# Patient Record
Sex: Male | Born: 1940
Health system: Southern US, Community
[De-identification: ages and names within clinical notes are randomized; demographics above are authoritative.]

## PROBLEM LIST (undated history)

## (undated) DIAGNOSIS — R011 Cardiac murmur, unspecified: Secondary | ICD-10-CM

## (undated) DIAGNOSIS — E785 Hyperlipidemia, unspecified: Secondary | ICD-10-CM

## (undated) DIAGNOSIS — I10 Essential (primary) hypertension: Secondary | ICD-10-CM

## (undated) DIAGNOSIS — H3320 Serous retinal detachment, unspecified eye: Secondary | ICD-10-CM

## (undated) DIAGNOSIS — R7303 Prediabetes: Secondary | ICD-10-CM

## (undated) DIAGNOSIS — E559 Vitamin D deficiency, unspecified: Secondary | ICD-10-CM

## (undated) HISTORY — PX: RETINAL DETACHMENT SURGERY: SHX105

## (undated) HISTORY — DX: Cardiac murmur, unspecified: R01.1

## (undated) HISTORY — PX: FOOT SURGERY: SHX648

## (undated) HISTORY — PX: OTHER SURGICAL HISTORY: SHX169

## (undated) HISTORY — DX: Serous retinal detachment, unspecified eye: H33.20

## (undated) HISTORY — PX: LACRIMAL GLAND BIOPSY: SHX1908

---
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
## 1968-04-24 HISTORY — PX: ANKLE ARTHROPLASTY: SUR68

## 2006-04-24 HISTORY — PX: OTHER SURGICAL HISTORY: SHX169

## 2016-05-01 DIAGNOSIS — D518 Other vitamin B12 deficiency anemias: Secondary | ICD-10-CM | POA: Diagnosis not present

## 2016-05-11 DIAGNOSIS — Z800 Family history of malignant neoplasm of digestive organs: Secondary | ICD-10-CM | POA: Diagnosis not present

## 2016-05-11 DIAGNOSIS — K5790 Diverticulosis of intestine, part unspecified, without perforation or abscess without bleeding: Secondary | ICD-10-CM | POA: Diagnosis not present

## 2016-05-11 DIAGNOSIS — Z6825 Body mass index (BMI) 25.0-25.9, adult: Secondary | ICD-10-CM | POA: Diagnosis not present

## 2016-05-11 DIAGNOSIS — J309 Allergic rhinitis, unspecified: Secondary | ICD-10-CM | POA: Diagnosis not present

## 2016-05-11 DIAGNOSIS — H3321 Serous retinal detachment, right eye: Secondary | ICD-10-CM | POA: Diagnosis not present

## 2016-05-11 DIAGNOSIS — E663 Overweight: Secondary | ICD-10-CM | POA: Diagnosis not present

## 2016-05-11 DIAGNOSIS — E785 Hyperlipidemia, unspecified: Secondary | ICD-10-CM | POA: Diagnosis not present

## 2016-05-11 DIAGNOSIS — Z9889 Other specified postprocedural states: Secondary | ICD-10-CM | POA: Diagnosis not present

## 2016-05-11 DIAGNOSIS — Z833 Family history of diabetes mellitus: Secondary | ICD-10-CM | POA: Diagnosis not present

## 2016-05-31 DIAGNOSIS — D518 Other vitamin B12 deficiency anemias: Secondary | ICD-10-CM | POA: Diagnosis not present

## 2016-06-15 DIAGNOSIS — H6123 Impacted cerumen, bilateral: Secondary | ICD-10-CM | POA: Diagnosis not present

## 2016-06-29 DIAGNOSIS — Z6826 Body mass index (BMI) 26.0-26.9, adult: Secondary | ICD-10-CM | POA: Diagnosis not present

## 2016-06-29 DIAGNOSIS — E782 Mixed hyperlipidemia: Secondary | ICD-10-CM | POA: Diagnosis not present

## 2016-06-29 DIAGNOSIS — H900 Conductive hearing loss, bilateral: Secondary | ICD-10-CM | POA: Diagnosis not present

## 2016-06-29 DIAGNOSIS — R69 Illness, unspecified: Secondary | ICD-10-CM | POA: Diagnosis not present

## 2016-06-29 DIAGNOSIS — D519 Vitamin B12 deficiency anemia, unspecified: Secondary | ICD-10-CM | POA: Diagnosis not present

## 2016-07-31 DIAGNOSIS — E538 Deficiency of other specified B group vitamins: Secondary | ICD-10-CM | POA: Diagnosis not present

## 2016-08-30 DIAGNOSIS — E538 Deficiency of other specified B group vitamins: Secondary | ICD-10-CM | POA: Diagnosis not present

## 2016-09-11 DIAGNOSIS — E538 Deficiency of other specified B group vitamins: Secondary | ICD-10-CM | POA: Diagnosis not present

## 2016-09-11 DIAGNOSIS — E785 Hyperlipidemia, unspecified: Secondary | ICD-10-CM | POA: Diagnosis not present

## 2016-09-11 DIAGNOSIS — N529 Male erectile dysfunction, unspecified: Secondary | ICD-10-CM | POA: Diagnosis not present

## 2016-09-11 DIAGNOSIS — R634 Abnormal weight loss: Secondary | ICD-10-CM | POA: Diagnosis not present

## 2016-10-16 DIAGNOSIS — E539 Vitamin B deficiency, unspecified: Secondary | ICD-10-CM | POA: Diagnosis not present

## 2016-10-16 DIAGNOSIS — D519 Vitamin B12 deficiency anemia, unspecified: Secondary | ICD-10-CM | POA: Diagnosis not present

## 2016-11-15 DIAGNOSIS — D513 Other dietary vitamin B12 deficiency anemia: Secondary | ICD-10-CM | POA: Diagnosis not present

## 2016-12-18 DIAGNOSIS — E539 Vitamin B deficiency, unspecified: Secondary | ICD-10-CM | POA: Diagnosis not present

## 2017-01-09 DIAGNOSIS — E785 Hyperlipidemia, unspecified: Secondary | ICD-10-CM | POA: Diagnosis not present

## 2017-01-09 DIAGNOSIS — E538 Deficiency of other specified B group vitamins: Secondary | ICD-10-CM | POA: Diagnosis not present

## 2017-01-09 DIAGNOSIS — R42 Dizziness and giddiness: Secondary | ICD-10-CM | POA: Diagnosis not present

## 2017-01-17 DIAGNOSIS — E538 Deficiency of other specified B group vitamins: Secondary | ICD-10-CM | POA: Diagnosis not present

## 2017-01-31 DIAGNOSIS — Z23 Encounter for immunization: Secondary | ICD-10-CM | POA: Diagnosis not present

## 2017-02-28 DIAGNOSIS — K219 Gastro-esophageal reflux disease without esophagitis: Secondary | ICD-10-CM | POA: Diagnosis not present

## 2017-02-28 DIAGNOSIS — Z6824 Body mass index (BMI) 24.0-24.9, adult: Secondary | ICD-10-CM | POA: Diagnosis not present

## 2017-02-28 DIAGNOSIS — N529 Male erectile dysfunction, unspecified: Secondary | ICD-10-CM | POA: Diagnosis not present

## 2017-02-28 DIAGNOSIS — E538 Deficiency of other specified B group vitamins: Secondary | ICD-10-CM | POA: Diagnosis not present

## 2017-02-28 DIAGNOSIS — E785 Hyperlipidemia, unspecified: Secondary | ICD-10-CM | POA: Diagnosis not present

## 2017-03-27 DIAGNOSIS — E538 Deficiency of other specified B group vitamins: Secondary | ICD-10-CM | POA: Diagnosis not present

## 2017-05-07 DIAGNOSIS — E538 Deficiency of other specified B group vitamins: Secondary | ICD-10-CM | POA: Diagnosis not present

## 2017-05-15 DIAGNOSIS — E538 Deficiency of other specified B group vitamins: Secondary | ICD-10-CM | POA: Diagnosis not present

## 2017-05-15 DIAGNOSIS — K219 Gastro-esophageal reflux disease without esophagitis: Secondary | ICD-10-CM | POA: Diagnosis not present

## 2017-05-15 DIAGNOSIS — R634 Abnormal weight loss: Secondary | ICD-10-CM | POA: Diagnosis not present

## 2017-05-15 DIAGNOSIS — Z6824 Body mass index (BMI) 24.0-24.9, adult: Secondary | ICD-10-CM | POA: Diagnosis not present

## 2017-05-15 DIAGNOSIS — E785 Hyperlipidemia, unspecified: Secondary | ICD-10-CM | POA: Diagnosis not present

## 2017-05-17 DIAGNOSIS — Z6824 Body mass index (BMI) 24.0-24.9, adult: Secondary | ICD-10-CM | POA: Diagnosis not present

## 2017-05-17 DIAGNOSIS — E785 Hyperlipidemia, unspecified: Secondary | ICD-10-CM | POA: Diagnosis not present

## 2017-05-17 DIAGNOSIS — K219 Gastro-esophageal reflux disease without esophagitis: Secondary | ICD-10-CM | POA: Diagnosis not present

## 2017-05-17 DIAGNOSIS — E538 Deficiency of other specified B group vitamins: Secondary | ICD-10-CM | POA: Diagnosis not present

## 2017-05-17 DIAGNOSIS — R634 Abnormal weight loss: Secondary | ICD-10-CM | POA: Diagnosis not present

## 2017-06-04 DIAGNOSIS — E538 Deficiency of other specified B group vitamins: Secondary | ICD-10-CM | POA: Diagnosis not present

## 2017-07-30 DIAGNOSIS — D513 Other dietary vitamin B12 deficiency anemia: Secondary | ICD-10-CM | POA: Diagnosis not present

## 2017-08-28 DIAGNOSIS — H60503 Unspecified acute noninfective otitis externa, bilateral: Secondary | ICD-10-CM | POA: Diagnosis not present

## 2017-09-18 DIAGNOSIS — Z961 Presence of intraocular lens: Secondary | ICD-10-CM | POA: Diagnosis not present

## 2017-09-18 DIAGNOSIS — H35373 Puckering of macula, bilateral: Secondary | ICD-10-CM | POA: Diagnosis not present

## 2017-09-18 DIAGNOSIS — H43813 Vitreous degeneration, bilateral: Secondary | ICD-10-CM | POA: Diagnosis not present

## 2017-09-18 DIAGNOSIS — E789 Disorder of lipoprotein metabolism, unspecified: Secondary | ICD-10-CM | POA: Diagnosis not present

## 2017-09-18 DIAGNOSIS — H33021 Retinal detachment with multiple breaks, right eye: Secondary | ICD-10-CM | POA: Diagnosis not present

## 2017-11-14 DIAGNOSIS — E785 Hyperlipidemia, unspecified: Secondary | ICD-10-CM | POA: Diagnosis not present

## 2017-11-14 DIAGNOSIS — R5383 Other fatigue: Secondary | ICD-10-CM | POA: Diagnosis not present

## 2017-11-14 DIAGNOSIS — E559 Vitamin D deficiency, unspecified: Secondary | ICD-10-CM | POA: Diagnosis not present

## 2017-11-14 DIAGNOSIS — R69 Illness, unspecified: Secondary | ICD-10-CM | POA: Diagnosis not present

## 2017-11-20 DIAGNOSIS — E785 Hyperlipidemia, unspecified: Secondary | ICD-10-CM | POA: Diagnosis not present

## 2017-11-20 DIAGNOSIS — Z125 Encounter for screening for malignant neoplasm of prostate: Secondary | ICD-10-CM | POA: Diagnosis not present

## 2017-11-20 DIAGNOSIS — R030 Elevated blood-pressure reading, without diagnosis of hypertension: Secondary | ICD-10-CM | POA: Diagnosis not present

## 2017-11-20 DIAGNOSIS — E559 Vitamin D deficiency, unspecified: Secondary | ICD-10-CM | POA: Diagnosis not present

## 2017-11-20 DIAGNOSIS — M545 Low back pain: Secondary | ICD-10-CM | POA: Diagnosis not present

## 2017-11-20 DIAGNOSIS — Z809 Family history of malignant neoplasm, unspecified: Secondary | ICD-10-CM | POA: Diagnosis not present

## 2017-11-20 DIAGNOSIS — E162 Hypoglycemia, unspecified: Secondary | ICD-10-CM | POA: Diagnosis not present

## 2017-11-20 DIAGNOSIS — N529 Male erectile dysfunction, unspecified: Secondary | ICD-10-CM | POA: Diagnosis not present

## 2017-11-20 DIAGNOSIS — E538 Deficiency of other specified B group vitamins: Secondary | ICD-10-CM | POA: Diagnosis not present

## 2017-11-20 DIAGNOSIS — Z7722 Contact with and (suspected) exposure to environmental tobacco smoke (acute) (chronic): Secondary | ICD-10-CM | POA: Diagnosis not present

## 2017-11-20 DIAGNOSIS — R69 Illness, unspecified: Secondary | ICD-10-CM | POA: Diagnosis not present

## 2017-11-20 DIAGNOSIS — Z833 Family history of diabetes mellitus: Secondary | ICD-10-CM | POA: Diagnosis not present

## 2017-11-20 DIAGNOSIS — R5383 Other fatigue: Secondary | ICD-10-CM | POA: Diagnosis not present

## 2017-12-31 DIAGNOSIS — H66009 Acute suppurative otitis media without spontaneous rupture of ear drum, unspecified ear: Secondary | ICD-10-CM | POA: Diagnosis not present

## 2017-12-31 DIAGNOSIS — E559 Vitamin D deficiency, unspecified: Secondary | ICD-10-CM | POA: Diagnosis not present

## 2017-12-31 DIAGNOSIS — E785 Hyperlipidemia, unspecified: Secondary | ICD-10-CM | POA: Diagnosis not present

## 2017-12-31 DIAGNOSIS — R69 Illness, unspecified: Secondary | ICD-10-CM | POA: Diagnosis not present

## 2017-12-31 DIAGNOSIS — R7302 Impaired glucose tolerance (oral): Secondary | ICD-10-CM | POA: Diagnosis not present

## 2018-01-22 DIAGNOSIS — Z23 Encounter for immunization: Secondary | ICD-10-CM | POA: Diagnosis not present

## 2018-02-21 DIAGNOSIS — R69 Illness, unspecified: Secondary | ICD-10-CM | POA: Diagnosis not present

## 2018-02-21 DIAGNOSIS — R42 Dizziness and giddiness: Secondary | ICD-10-CM | POA: Diagnosis not present

## 2018-02-21 DIAGNOSIS — R7302 Impaired glucose tolerance (oral): Secondary | ICD-10-CM | POA: Diagnosis not present

## 2018-02-21 DIAGNOSIS — H66009 Acute suppurative otitis media without spontaneous rupture of ear drum, unspecified ear: Secondary | ICD-10-CM | POA: Diagnosis not present

## 2018-03-06 DIAGNOSIS — J069 Acute upper respiratory infection, unspecified: Secondary | ICD-10-CM | POA: Diagnosis not present

## 2018-05-09 DIAGNOSIS — R7302 Impaired glucose tolerance (oral): Secondary | ICD-10-CM | POA: Diagnosis not present

## 2018-05-09 DIAGNOSIS — R5383 Other fatigue: Secondary | ICD-10-CM | POA: Diagnosis not present

## 2018-05-09 DIAGNOSIS — E559 Vitamin D deficiency, unspecified: Secondary | ICD-10-CM | POA: Diagnosis not present

## 2018-06-07 DIAGNOSIS — H35371 Puckering of macula, right eye: Secondary | ICD-10-CM | POA: Diagnosis not present

## 2018-07-08 DIAGNOSIS — A0839 Other viral enteritis: Secondary | ICD-10-CM | POA: Diagnosis not present

## 2018-09-05 ENCOUNTER — Ambulatory Visit (INDEPENDENT_AMBULATORY_CARE_PROVIDER_SITE_OTHER): Payer: Medicare HMO | Admitting: Nurse Practitioner

## 2018-09-05 DIAGNOSIS — R7303 Prediabetes: Secondary | ICD-10-CM | POA: Diagnosis not present

## 2018-09-05 DIAGNOSIS — E785 Hyperlipidemia, unspecified: Secondary | ICD-10-CM | POA: Diagnosis not present

## 2018-09-05 DIAGNOSIS — W57XXXA Bitten or stung by nonvenomous insect and other nonvenomous arthropods, initial encounter: Secondary | ICD-10-CM | POA: Diagnosis not present

## 2018-09-05 DIAGNOSIS — A0839 Other viral enteritis: Secondary | ICD-10-CM | POA: Diagnosis not present

## 2018-09-26 DIAGNOSIS — L0390 Cellulitis, unspecified: Secondary | ICD-10-CM | POA: Diagnosis not present

## 2018-10-01 ENCOUNTER — Telehealth: Payer: Self-pay

## 2018-10-01 ENCOUNTER — Other Ambulatory Visit: Payer: Self-pay

## 2018-10-01 ENCOUNTER — Other Ambulatory Visit: Payer: Medicare HMO

## 2018-10-01 ENCOUNTER — Telehealth: Payer: Self-pay | Admitting: General Practice

## 2018-10-01 ENCOUNTER — Ambulatory Visit
Admission: EM | Admit: 2018-10-01 | Discharge: 2018-10-01 | Disposition: A | Discharge status: Home / Self Care | Payer: Medicare HMO

## 2018-10-01 DIAGNOSIS — M2550 Pain in unspecified joint: Secondary | ICD-10-CM

## 2018-10-01 DIAGNOSIS — Z20822 Contact with and (suspected) exposure to covid-19: Secondary | ICD-10-CM

## 2018-10-01 DIAGNOSIS — M791 Myalgia, unspecified site: Secondary | ICD-10-CM

## 2018-10-01 DIAGNOSIS — S30861A Insect bite (nonvenomous) of abdominal wall, initial encounter: Secondary | ICD-10-CM

## 2018-10-01 DIAGNOSIS — R6889 Other general symptoms and signs: Secondary | ICD-10-CM | POA: Diagnosis not present

## 2018-10-01 DIAGNOSIS — M7918 Myalgia, other site: Secondary | ICD-10-CM | POA: Diagnosis not present

## 2018-10-01 MED ORDER — DOXYCYCLINE HYCLATE 100 MG PO CAPS: 100.0000 mg | ORAL_CAPSULE | Freq: Two times a day (BID) | ORAL | 0 refills | Status: AC

## 2018-10-01 NOTE — ED Provider Notes (Signed)
[place]   [id number] 10/01/18 Arrival Time: 4097   CC: Not feeling well  SUBJECTIVE: History from: patient.  Joel Guerra is a 78 y.o. male hx significant for murmur, who presents with improving body aches, and joint pain x 3 days.  States the widespread joint pain/ muscle aches kept him up all night, and now feels fatigued and "woozy headed."  Denies sick exposure to COVID, flu or strep.  Denies recent travel.  Did remove an engorged tick from abdomen approximately 1-2 weeks ago.  Was started on augmentin by PCP.  Patient took medication for 3 days, and then discontinued due to GI upset.  Tried ice joints and muscles without relief. Denies fever, chills, sinus pain, rhinorrhea, congestion, sore throat, SOB, wheezing, chest pain, chest pressure, abdominal pain, nausea, vomiting, changes in bowel or bladder habits, rash.    ROS: As per HPI.  History reviewed. No pertinent past medical history. Past Surgical History:  Procedure Laterality Date  . ANKLE ARTHROPLASTY  1970   No Known Allergies No current facility-administered medications on file prior to encounter.    Current Outpatient Medications on File Prior to Encounter  Medication Sig Dispense Refill  . cholecalciferol (VITAMIN D3) 25 MCG (1000 UT) tablet Take 2,000 Units by mouth daily.     Social History   Socioeconomic History  . Marital status: Married    Spouse name: Not on file  . Number of children: Not on file  . Years of education: Not on file  . Highest education level: Not on file  Occupational History  . Not on file  Social Needs  . Financial resource strain: Not on file  . Food insecurity:    Worry: Not on file    Inability: Not on file  . Transportation needs:    Medical: Not on file    Non-medical: Not on file  Tobacco Use  . Smoking status: Never Smoker  . Smokeless tobacco: Never Used  Substance and Sexual Activity  . Alcohol use: Yes  . Drug use: Not on file  . Sexual activity: Not  on file  Lifestyle  . Physical activity:    Days per week: Not on file    Minutes per session: Not on file  . Stress: Not on file  Relationships  . Social connections:    Talks on phone: Not on file    Gets together: Not on file    Attends religious service: Not on file    Active member of club or organization: Not on file    Attends meetings of clubs or organizations: Not on file    Relationship status: Not on file  . Intimate partner violence:    Fear of current or ex partner: Not on file    Emotionally abused: Not on file    Physically abused: Not on file    Forced sexual activity: Not on file  Other Topics Concern  . Not on file  Social History Narrative  . Not on file   Family History  Problem Relation Age of Onset  . Cancer Father     OBJECTIVE:  Vitals:   10/01/18 1212  BP: 138/75  Pulse: 74  Resp: 18  Temp: 98.3 F (36.8 C)  SpO2: 95%    General appearance: alert; appears mildly fatigued, but nontoxic; speaking in full sentences and tolerating own secretions HEENT: NCAT; Ears: EACs clear, TMs pearly gray; Eyes: PERRL.  EOM grossly intact. Nose: nares patent without rhinorrhea, Throat: oropharynx clear, tonsils  non erythematous or enlarged, uvula midline  Neck: supple without LAD Lungs: unlabored respirations, symmetrical air entry; cough: absent; no respiratory distress; CTAB Heart: soft systolic murmur, patient reports murmur present since childhood.  Radial pulses 2+ symmetrical bilaterally.  Cap refill < 2 seconds Skin: warm and dry; small area of erythema over RUQ of the abdomen where tick was removed Psychological: alert and cooperative; normal mood and affect  ASSESSMENT & PLAN:  1. Arthralgia, unspecified joint   2. Myalgia   3. Tick bite of abdomen, initial encounter     Meds ordered this encounter  Medications  . doxycycline (VIBRAMYCIN) 100 MG capsule    Sig: Take 1 capsule (100 mg total) by mouth 2 (two) times daily for 14 days.     Dispense:  28 capsule    Refill:  0    Order Specific Question:   Supervising Provider    Answer:   Eustace MooreELSON, YVONNE SUE [[id number]]   Rest and push fluids We will treat you today for tick bite Doxycycline prescribed.  Take as directed and to completion Follow up with PCP this week for recheck and to ensure your symptoms are improving.  COVID testing ordered.  Outpatient center will contact you regarding your appointment  In the meantime: You should remain isolated in your home for 7 days from symptom onset AND greater than 72 hours after symptoms resolution (absence of fever without the use of fever-reducing medication and improvement in respiratory symptoms), whichever is longer Get plenty of rest and push fluids You may use OTC zyrtec and/or flonase as needed for congestion and/ or runny nose Take OTC tylenol as needed for fever, body aches, and/or chills Call or go to the ED if you have any new or worsening symptoms such as fever, worsening cough, shortness of breath, chest tightness, chest pain, turning blue, changes in mental status, etc...  Reviewed expectations re: course of current medical issues. Questions answered. Outlined signs and symptoms indicating need for more acute intervention. Patient verbalized understanding. After Visit Summary given.         Rennis HardingWurst, Nayda Riesen, PA-C 10/01/18 1305

## 2018-10-01 NOTE — Discharge Instructions (Signed)
Rest and push fluids We will treat you today for tick bite Doxycycline prescribed.  Take as directed and to completion Follow up with PCP this week for recheck and to ensure your symptoms are improving.  COVID testing ordered.  Outpatient center will contact you regarding your appointment  In the meantime: You should remain isolated in your home for 7 days from symptom onset AND greater than 72 hours after symptoms resolution (absence of fever without the use of fever-reducing medication and improvement in respiratory symptoms), whichever is longer Get plenty of rest and push fluids You may use OTC zyrtec and/or flonase as needed for congestion and/ or runny nose Take OTC tylenol as needed for fever, body aches, and/or chills Call or go to the ED if you have any new or worsening symptoms such as fever, worsening cough, shortness of breath, chest tightness, chest pain, turning blue, changes in mental status, etc..Marland Kitchen

## 2018-10-01 NOTE — Telephone Encounter (Signed)
Dr. Gosrani request COVID 19 test. 

## 2018-10-01 NOTE — Telephone Encounter (Signed)
-----   Message from Brittany Wurst, PA-C sent at 10/01/2018 12:39 PM EDT ----- Regarding: COVID testing Fatigue, joint pain and body aches x 3 days  

## 2018-10-01 NOTE — Telephone Encounter (Signed)
-----   Message from [place], [place] sent at 10/01/2018 12:39 PM EDT ----- Regarding: COVID testing Fatigue, joint pain and body aches x 3 days

## 2018-10-01 NOTE — ED Triage Notes (Signed)
Pt states, that he hasn't felt well the past 3 days, states that  he has had muscle pain and body aches that kept him awake last night , with extreme fatigue and"woozy headed'

## 2018-10-01 NOTE — Telephone Encounter (Signed)
Called, lvm for pt to return call to schedule covid testing.  ° ° °

## 2018-10-01 NOTE — Telephone Encounter (Signed)
Called patient to set up COVID-19 testing. Left VM to return call to [facility] for testing @ [phone].  Britney wurst PA-C Dr Hurshel Party MD

## 2018-10-01 NOTE — Telephone Encounter (Signed)
Left message to call back to schedule covid 19 test.

## 2018-10-01 NOTE — Telephone Encounter (Signed)
Call dropped, attempted to call pt. Back. Left message.

## 2018-10-01 NOTE — Addendum Note (Signed)
Addended by: Linus Orn A on: 10/01/2018 03:08 PM   Modules accepted: Orders

## 2018-10-03 ENCOUNTER — Telehealth: Payer: Self-pay

## 2018-10-03 ENCOUNTER — Emergency Department (HOSPITAL_COMMUNITY)
Admission: EM | Admit: 2018-10-03 | Discharge: 2018-10-03 | Disposition: A | Discharge status: Home / Self Care | Payer: Medicare HMO | Attending: Emergency Medicine | Admitting: Emergency Medicine

## 2018-10-03 ENCOUNTER — Encounter (HOSPITAL_COMMUNITY): Payer: Self-pay | Admitting: Emergency Medicine

## 2018-10-03 ENCOUNTER — Other Ambulatory Visit: Payer: Self-pay

## 2018-10-03 DIAGNOSIS — W57XXXA Bitten or stung by nonvenomous insect and other nonvenomous arthropods, initial encounter: Secondary | ICD-10-CM | POA: Diagnosis not present

## 2018-10-03 DIAGNOSIS — Y929 Unspecified place or not applicable: Secondary | ICD-10-CM | POA: Insufficient documentation

## 2018-10-03 DIAGNOSIS — R531 Weakness: Secondary | ICD-10-CM

## 2018-10-03 DIAGNOSIS — S0086XA Insect bite (nonvenomous) of other part of head, initial encounter: Secondary | ICD-10-CM | POA: Insufficient documentation

## 2018-10-03 DIAGNOSIS — Y939 Activity, unspecified: Secondary | ICD-10-CM | POA: Diagnosis not present

## 2018-10-03 DIAGNOSIS — R778 Other specified abnormalities of plasma proteins: Secondary | ICD-10-CM

## 2018-10-03 DIAGNOSIS — Y999 Unspecified external cause status: Secondary | ICD-10-CM | POA: Diagnosis not present

## 2018-10-03 DIAGNOSIS — S30861A Insect bite (nonvenomous) of abdominal wall, initial encounter: Secondary | ICD-10-CM | POA: Diagnosis not present

## 2018-10-03 DIAGNOSIS — R7989 Other specified abnormal findings of blood chemistry: Secondary | ICD-10-CM | POA: Diagnosis not present

## 2018-10-03 LAB — COMPREHENSIVE METABOLIC PANEL
ALT: 13 U/L (ref 0–44)
AST: 23 U/L (ref 15–41)
Albumin: 3.6 g/dL (ref 3.5–5.0)
Alkaline Phosphatase: 45 U/L (ref 38–126)
Anion gap: 9 (ref 5–15)
BUN: 13 mg/dL (ref 8–23)
CO2: 25 mmol/L (ref 22–32)
Calcium: 8.5 mg/dL — ABNORMAL LOW (ref 8.9–10.3)
Chloride: 98 mmol/L (ref 98–111)
Creatinine, Ser: 0.79 mg/dL (ref 0.61–1.24)
GFR calc Af Amer: >60 mL/min (ref >60)
GFR calc non Af Amer: >60 mL/min (ref >60)
Glucose, Bld: 121 mg/dL — ABNORMAL HIGH (ref 70–99)
Potassium: 4 mmol/L (ref 3.5–5.1)
Sodium: 132 mmol/L — ABNORMAL LOW (ref 135–145)
Total Bilirubin: 0.3 mg/dL (ref 0.3–1.2)
Total Protein: 6.5 g/dL (ref 6.5–8.1)

## 2018-10-03 LAB — URINALYSIS, ROUTINE W REFLEX MICROSCOPIC
Bilirubin Urine: NEGATIVE
Glucose, UA: NEGATIVE mg/dL
Hgb urine dipstick: NEGATIVE
Ketones, ur: NEGATIVE mg/dL
Leukocytes,Ua: NEGATIVE
Nitrite: NEGATIVE
Protein, ur: NEGATIVE mg/dL
Specific Gravity, Urine: 1.019 (ref 1.005–1.030)
pH: 6 (ref 5.0–8.0)

## 2018-10-03 LAB — CBC WITH DIFFERENTIAL/PLATELET
Abs Immature Granulocytes: 0 10*3/uL (ref 0.00–0.07)
Basophils Absolute: 0 10*3/uL (ref 0.0–0.1)
Basophils Relative: 0 %
Eosinophils Absolute: 0 10*3/uL (ref 0.0–0.5)
Eosinophils Relative: 1 %
HCT: 43.9 % (ref 39.0–52.0)
Hemoglobin: 14.4 g/dL (ref 13.0–17.0)
Immature Granulocytes: 0 %
Lymphocytes Relative: 24 %
Lymphs Abs: 0.5 10*3/uL — ABNORMAL LOW (ref 0.7–4.0)
MCH: 30.8 pg (ref 26.0–34.0)
MCHC: 32.8 g/dL (ref 30.0–36.0)
MCV: 94 fL (ref 80.0–100.0)
Monocytes Absolute: 0.4 10*3/uL (ref 0.1–1.0)
Monocytes Relative: 18 %
Neutro Abs: 1.1 10*3/uL — ABNORMAL LOW (ref 1.7–7.7)
Neutrophils Relative %: 57 %
Platelets: 158 10*3/uL (ref 150–400)
RBC: 4.67 MIL/uL (ref 4.22–5.81)
RDW: 13.9 % (ref 11.5–15.5)
WBC: 1.9 10*3/uL — ABNORMAL LOW (ref 4.0–10.5)
nRBC: 0 % (ref 0.0–0.2)

## 2018-10-03 LAB — TROPONIN I
Troponin I: 0.06 ng/mL (ref <0.03)
Troponin I: <0.03 ng/mL (ref <0.03)

## 2018-10-03 LAB — SEDIMENTATION RATE: Sed Rate: 4 mm/hr (ref 0–16)

## 2018-10-03 MED ORDER — MECLIZINE HCL 25 MG PO TABS: 25.0000 mg | ORAL_TABLET | Freq: Three times a day (TID) | ORAL | 0 refills | Status: DC | PRN

## 2018-10-03 NOTE — Telephone Encounter (Signed)
Received call from pts wife stating that pt was feeling worse with increased fatigue, advised pts wife to take PT  to ED for further evaluation. COVID testing is pending

## 2018-10-03 NOTE — Discharge Instructions (Addendum)
Follow up with your md next week.  Keep taking your doxycycline

## 2018-10-03 NOTE — ED Notes (Signed)
CRITICAL VALUE ALERT  Critical Value: Troponin 0.06  Date & Time Notied:  10/03/2018 @ 3338  Provider Notified: Dr Lacinda Axon  Orders Received/Actions taken: See new orders.

## 2018-10-03 NOTE — ED Provider Notes (Addendum)
[facility] EMERGENCY DEPARTMENT Provider Note   CSN: [id number] Arrival date & time: 10/03/18  1046    History   Chief Complaint Chief Complaint  Patient presents with  . Insect Bite    HPI Joel Guerra is a 78 y.o. male.     Generalized weakness and malaise for several days.  Status post tick bite on abdomen approximately 1 week ago.  He was initially Rx'd with Augmentin, but he did not tolerate this well.  He is presently on doxycycline.  He states pain on the soft tissue of the lateral aspect of each eye.  No visual changes, neuro deficits, stiff neck, chest pain, dyspnea, fever, sweats, chills, rash.  Does have a headache.  No previous cardiac history.  Nothing makes symptoms better or worse.     History reviewed. No pertinent past medical history.  There are no active problems to display for this patient.   Past Surgical History:  Procedure Laterality Date  . ANKLE ARTHROPLASTY  1970  . FOOT SURGERY    . LACRIMAL GLAND BIOPSY          Home Medications    Prior to Admission medications   Medication Sig Start Date End Date Taking? Authorizing Provider  cholecalciferol (VITAMIN D3) 25 MCG (1000 UT) tablet Take 2,000 Units by mouth daily.   Yes [provider]  doxycycline (VIBRAMYCIN) 100 MG capsule Take 1 capsule (100 mg total) by mouth 2 (two) times daily for 14 days. 10/01/18 10/15/18 Yes Wurst, [place], PA-C  Polyethyl Glycol-Propyl Glycol (SYSTANE) 0.4-0.3 % SOLN Apply 1 drop to eye daily.   Yes [provider]    Family History Family History  Problem Relation Age of Onset  . Cancer Father     Social History Social History   Tobacco Use  . Smoking status: Never Smoker  . Smokeless tobacco: Never Used  Substance Use Topics  . Alcohol use: Yes  . Drug use: Never     Allergies   Patient has no known allergies.   Review of Systems Review of Systems  All other systems reviewed and are negative.    Physical Exam Updated  Vital Signs BP 137/73   Pulse 65   Temp 97.8 F (36.6 C) (Oral)   Resp (!) 21   Ht 6\' 1"  (1.854 m)   Wt 81.6 kg   SpO2 97%   BMI 23.75 kg/m   Physical Exam Vitals signs and nursing note reviewed.  Constitutional:      Appearance: He is well-developed.  HENT:     Head: Normocephalic and atraumatic.  Eyes:     Conjunctiva/sclera: Conjunctivae normal.  Neck:     Musculoskeletal: Neck supple.  Cardiovascular:     Rate and Rhythm: Normal rate and regular rhythm.  Pulmonary:     Effort: Pulmonary effort is normal.     Breath sounds: Normal breath sounds.  Abdominal:     General: Bowel sounds are normal.     Palpations: Abdomen is soft.  Musculoskeletal: Normal range of motion.  Skin:    Comments: Lower abdomen: 1.0 cm erythematous macular area consistent with bite.  Neurological:     Mental Status: He is alert and oriented to person, place, and time.  Psychiatric:        Behavior: Behavior normal.      ED Treatments / Results  Labs (all labs ordered are listed, but only abnormal results are displayed) Labs Reviewed  CBC WITH DIFFERENTIAL/PLATELET - Abnormal; Notable for the following  components:      Result Value   WBC 1.9 (*)    Neutro Abs 1.1 (*)    Lymphs Abs 0.5 (*)    All other components within normal limits  COMPREHENSIVE METABOLIC PANEL - Abnormal; Notable for the following components:   Sodium 132 (*)    Glucose, Bld 121 (*)    Calcium 8.5 (*)    All other components within normal limits  TROPONIN I - Abnormal; Notable for the following components:   Troponin I 0.06 (*)    All other components within normal limits  URINALYSIS, ROUTINE W REFLEX MICROSCOPIC  SEDIMENTATION RATE  LYME DISEASE DNA BY PCR(BORRELIA BURG)  ROCKY MTN SPOTTED FVR ABS PNL(IGG+IGM)  TROPONIN I    EKG EKG Interpretation  Date/Time:  Thursday October 03 2018 11:51:04 EDT Ventricular Rate:  67 PR Interval:    QRS Duration: 81 QT Interval:  379 QTC Calculation: 400 R Axis:    83 Text Interpretation:  Sinus rhythm Borderline right axis deviation Baseline wander in lead(s) V5 V6 Confirmed by Donnetta Hutchingook, Laasia Arcos ([id number]) on 10/03/2018 12:35:02 PM   Radiology No results found.  Procedures Procedures (including critical care time)  Medications Ordered in ED Medications - No data to display   Initial Impression / Assessment and Plan / ED Course  I have reviewed the triage vital signs and the nursing notes.  Pertinent labs & imaging results that were available during my care of the patient were reviewed by me and considered in my medical decision making (see chart for details).        Patient presents with general weakness and concerns about a tick bite with associated questions about RMSF and Lyme disease.  White count low.  EKG normal.  First troponin 0 0.06.  Will obtain RMSF and Lyme titers, second troponin  1430: Second troponin pending.  No chest pain.  Will discuss with Dr. Estell HarpinZammit.  Final Clinical Impressions(s) / ED Diagnoses   Final diagnoses:  Weakness  Tick bite, initial encounter  Elevated troponin    ED Discharge Orders    None       Donnetta Hutchingook, Masaichi Kracht, MD 10/03/18 1255    Donnetta Hutchingook, Glynnis Gavel, MD 10/03/18 1348    Donnetta Hutchingook, Asani Mcburney, MD 10/03/18 1445

## 2018-10-03 NOTE — ED Notes (Addendum)
Removed tick from abdomen, just below umbilicus about one week ago.  C/o itching to site and area slightly red.  About size of end of pencil eraser.

## 2018-10-03 NOTE — ED Triage Notes (Signed)
Pt was bit by a tick on his abdomen a week ago.  C/o of redness

## 2018-10-04 ENCOUNTER — Emergency Department (HOSPITAL_COMMUNITY)
Admission: EM | Admit: 2018-10-04 | Discharge: 2018-10-04 | Disposition: A | Discharge status: Home / Self Care | Payer: Medicare HMO | Attending: Emergency Medicine | Admitting: Emergency Medicine

## 2018-10-04 ENCOUNTER — Other Ambulatory Visit: Payer: Self-pay

## 2018-10-04 ENCOUNTER — Encounter (HOSPITAL_COMMUNITY): Payer: Self-pay | Admitting: *Deleted

## 2018-10-04 ENCOUNTER — Emergency Department (HOSPITAL_COMMUNITY): Payer: Medicare HMO

## 2018-10-04 DIAGNOSIS — R5383 Other fatigue: Secondary | ICD-10-CM | POA: Insufficient documentation

## 2018-10-04 DIAGNOSIS — S3993XA Unspecified injury of pelvis, initial encounter: Secondary | ICD-10-CM | POA: Diagnosis not present

## 2018-10-04 DIAGNOSIS — R55 Syncope and collapse: Secondary | ICD-10-CM | POA: Diagnosis not present

## 2018-10-04 DIAGNOSIS — R946 Abnormal results of thyroid function studies: Secondary | ICD-10-CM | POA: Diagnosis not present

## 2018-10-04 DIAGNOSIS — R42 Dizziness and giddiness: Secondary | ICD-10-CM

## 2018-10-04 DIAGNOSIS — Z79899 Other long term (current) drug therapy: Secondary | ICD-10-CM | POA: Diagnosis not present

## 2018-10-04 DIAGNOSIS — R7989 Other specified abnormal findings of blood chemistry: Secondary | ICD-10-CM | POA: Diagnosis not present

## 2018-10-04 DIAGNOSIS — R4020 Unspecified coma: Secondary | ICD-10-CM | POA: Diagnosis not present

## 2018-10-04 DIAGNOSIS — R102 Pelvic and perineal pain: Secondary | ICD-10-CM | POA: Diagnosis not present

## 2018-10-04 LAB — COMPREHENSIVE METABOLIC PANEL
ALT: 15 U/L (ref 0–44)
AST: 28 U/L (ref 15–41)
Albumin: 3.6 g/dL (ref 3.5–5.0)
Alkaline Phosphatase: 45 U/L (ref 38–126)
Anion gap: 13 (ref 5–15)
BUN: 15 mg/dL (ref 8–23)
CO2: 23 mmol/L (ref 22–32)
Calcium: 8.5 mg/dL — ABNORMAL LOW (ref 8.9–10.3)
Chloride: 95 mmol/L — ABNORMAL LOW (ref 98–111)
Creatinine, Ser: 0.79 mg/dL (ref 0.61–1.24)
GFR calc Af Amer: >60 mL/min (ref >60)
GFR calc non Af Amer: >60 mL/min (ref >60)
Glucose, Bld: 109 mg/dL — ABNORMAL HIGH (ref 70–99)
Potassium: 4.3 mmol/L (ref 3.5–5.1)
Sodium: 131 mmol/L — ABNORMAL LOW (ref 135–145)
Total Bilirubin: 0.7 mg/dL (ref 0.3–1.2)
Total Protein: 6.5 g/dL (ref 6.5–8.1)

## 2018-10-04 LAB — CBC WITH DIFFERENTIAL/PLATELET
Abs Immature Granulocytes: 0.01 10*3/uL (ref 0.00–0.07)
Basophils Absolute: 0 10*3/uL (ref 0.0–0.1)
Basophils Relative: 0 %
Eosinophils Absolute: 0 10*3/uL (ref 0.0–0.5)
Eosinophils Relative: 0 %
HCT: 43.9 % (ref 39.0–52.0)
Hemoglobin: 14.6 g/dL (ref 13.0–17.0)
Immature Granulocytes: 0 %
Lymphocytes Relative: 32 %
Lymphs Abs: 0.7 10*3/uL (ref 0.7–4.0)
MCH: 30.7 pg (ref 26.0–34.0)
MCHC: 33.3 g/dL (ref 30.0–36.0)
MCV: 92.2 fL (ref 80.0–100.0)
Monocytes Absolute: 0.3 10*3/uL (ref 0.1–1.0)
Monocytes Relative: 13 %
Neutro Abs: 1.2 10*3/uL — ABNORMAL LOW (ref 1.7–7.7)
Neutrophils Relative %: 55 %
Platelets: 151 10*3/uL (ref 150–400)
RBC: 4.76 MIL/uL (ref 4.22–5.81)
RDW: 13.8 % (ref 11.5–15.5)
WBC: 2.3 10*3/uL — ABNORMAL LOW (ref 4.0–10.5)
nRBC: 0 % (ref 0.0–0.2)

## 2018-10-04 LAB — URINALYSIS, ROUTINE W REFLEX MICROSCOPIC
Bilirubin Urine: NEGATIVE
Glucose, UA: NEGATIVE mg/dL
Hgb urine dipstick: NEGATIVE
Ketones, ur: NEGATIVE mg/dL
Leukocytes,Ua: NEGATIVE
Nitrite: NEGATIVE
Protein, ur: NEGATIVE mg/dL
Specific Gravity, Urine: 1.017 (ref 1.005–1.030)
pH: 6 (ref 5.0–8.0)

## 2018-10-04 LAB — MAGNESIUM: Magnesium: 1.7 mg/dL (ref 1.7–2.4)

## 2018-10-04 LAB — TSH: TSH: 0.098 u[IU]/mL — ABNORMAL LOW (ref 0.350–4.500)

## 2018-10-04 LAB — CBG MONITORING, ED: Glucose-Capillary: 104 mg/dL — ABNORMAL HIGH (ref 70–99)

## 2018-10-04 LAB — I-STAT TROPONIN, ED: Troponin i, poc: 0.01 ng/mL (ref 0.00–0.08)

## 2018-10-04 LAB — T4, FREE: Free T4: 1.47 ng/dL — ABNORMAL HIGH (ref 0.61–1.12)

## 2018-10-04 LAB — TROPONIN I: Troponin I: <0.03 ng/mL (ref <0.03)

## 2018-10-04 MED ORDER — KETOROLAC TROMETHAMINE 30 MG/ML IJ SOLN
15.0000 mg | Freq: Once | INTRAMUSCULAR | Status: AC
  Administered 2018-10-04: 15 mg via INTRAVENOUS
  Filled 2018-10-04: qty 1

## 2018-10-04 MED ORDER — SODIUM CHLORIDE 0.9 % IV BOLUS
500.0000 mL | Freq: Once | INTRAVENOUS | Status: AC
  Administered 2018-10-04: 500 mL via INTRAVENOUS

## 2018-10-04 MED ORDER — MECLIZINE HCL 12.5 MG PO TABS
25.0000 mg | ORAL_TABLET | Freq: Once | ORAL | Status: AC
  Administered 2018-10-04: 25 mg via ORAL
  Filled 2018-10-04: qty 2

## 2018-10-04 NOTE — ED Notes (Signed)
Pt ambulated in room without difficulty  

## 2018-10-04 NOTE — ED Notes (Signed)
Amy Stone from [facility] urgent [facility] called, states pt had one episode of LOC at home per wife.  When pt was Alert, denied remembrance of syncopal episdoe

## 2018-10-04 NOTE — ED Notes (Signed)
Pt states he believes the Antivert has made him feel better.

## 2018-10-04 NOTE — Telephone Encounter (Signed)
Received call from pts wife stating that he had an episode earlier where he lost consciousness and did not remember, was advised  to notify PCP and to return to ED

## 2018-10-04 NOTE — ED Notes (Signed)
Patient transported to X-ray 

## 2018-10-04 NOTE — Discharge Instructions (Addendum)
Your evaluated today for fatigue.  Your work-up was unremarkable in the ED except for a low thyroid level.  I suggest following up with your PCP on Monday for reevaluation of this.  Take the Meclizine prescribed to you by the provider from yesterday. Please take as prescribed.  Return to the ED for any new worsening symptoms.

## 2018-10-04 NOTE — ED Notes (Signed)
Patient returned from x-ray Still awaiting CT, and they are delayed at this time due to volume

## 2018-10-04 NOTE — ED Provider Notes (Signed)
[facility]   CSN: [id number] Arrival date & time: 10/04/18  1255  History   Chief Complaint Chief Complaint  Patient presents with   Fatigue    HPI Joel MalkinGlen Guerra is a 78 y.o. male who appears otherwise well presents for evaluation of fatigue.  Patient seen in ED yesterday with negative work-up.  Has continued to feel fatigue today.  Patient states that he had an episode of lightheaded and dizziness in which he had a near- syncopal event.  Patient denies preceding chest pain, shortness of breath, sudden onset thunderclap headache.  Unsure if he hit his head.  Fall was witnessed by his wife.  He did have a recent tick bite and received outpatient titers for [facility] spotted fever and Lyme yesterday.  He did have mildly elevated troponin yesterday however repeat was negative.  He is denying chest pain or shortness of breath.  He states he has had intermittent dizziness however this only occurs with movement.  No fever, chills, headache, neck pain, neck stiffness, lateral weakness, slurred speech, chest pain, shortness of breath, abdominal pain, diarrhea, dysuria.  He did receive an outpatient COVID test by his PCP 3 days ago which has not resulted.  Denies additional aggravating or alleviating factors.  Denies previous history of CVA, MI, congestive heart failure, anemia or prior near syncopal or syncopal events.  Denies any recent injuries or trauma.  History obtained from patient and patient's wife via telephone.  No interpreter was used.     HPI  History reviewed. No pertinent past medical history.  There are no active problems to display for this patient.   Past Surgical History:  Procedure Laterality Date   ANKLE ARTHROPLASTY  1970   FOOT SURGERY     LACRIMAL GLAND BIOPSY          Home Medications    Prior to Admission medications   Medication Sig Start Date End Date Taking? Authorizing Provider  cetirizine (ZYRTEC) 10 MG tablet  Take 10 mg by mouth daily as needed for allergies.   Yes [provider]  cholecalciferol (VITAMIN D3) 25 MCG (1000 UT) tablet Take 2,000 Units by mouth daily.   Yes [provider]  doxycycline (VIBRAMYCIN) 100 MG capsule Take 1 capsule (100 mg total) by mouth 2 (two) times daily for 14 days. 10/01/18 10/15/18 Yes Wurst, [place], PA-C  meclizine (ANTIVERT) 25 MG tablet Take 1 tablet (25 mg total) by mouth 3 (three) times daily as needed for dizziness. 10/03/18  Yes Bethann BerkshireZammit, Joseph, MD  Polyethyl Glycol-Propyl Glycol (SYSTANE) 0.4-0.3 % SOLN Apply 1 drop to eye daily.   Yes [provider]    Family History Family History  Problem Relation Age of Onset   Cancer Father     Social History Social History   Tobacco Use   Smoking status: Never Smoker   Smokeless tobacco: Never Used  Substance Use Topics   Alcohol use: Yes   Drug use: Never     Allergies   Patient has no known allergies.   Review of Systems Review of Systems  Constitutional: Negative.   HENT: Negative.   Eyes: Negative.   Respiratory: Negative.   Cardiovascular: Negative.   Gastrointestinal: Negative.   Endocrine: Negative.   Genitourinary: Negative.   Musculoskeletal: Negative.   Skin: Negative.   Neurological: Positive for dizziness and syncope. Negative for tremors, seizures, facial asymmetry, speech difficulty, weakness, light-headedness, numbness and headaches.  All other systems reviewed and are negative.  Physical Exam Updated Vital Signs BP 136/75    Pulse 65    Temp 98.1 F (36.7 C) (Oral)    Resp (!) 28    Ht 6\' 1"  (1.854 m)    Wt 81.6 kg    SpO2 98%    BMI 23.75 kg/m   Physical Exam  Physical Exam  Constitutional: Pt is oriented to person, place, and time. Pt appears well-developed and well-nourished. No distress.  HENT:  Head: Normocephalic and atraumatic.  Mouth/Throat: Oropharynx is clear and moist.  Eyes: Conjunctivae and EOM are normal. Pupils are  equal, round, and reactive to light. No scleral icterus.  No horizontal, vertical or rotational nystagmus  Neck: Normal range of motion. Neck supple.  Full active and passive ROM without pain No midline or paraspinal tenderness No nuchal rigidity or meningeal signs  Cardiovascular: Normal rate, regular rhythm and intact distal pulses.   Pulmonary/Chest: Effort normal and breath sounds normal. No respiratory distress. Pt has no wheezes. No rales.  Abdominal: Soft. Bowel sounds are normal. There is no tenderness. There is no rebound and no guarding.  Punctate area of erythema to midline abdomen just below umbilicus.  Consistent with insect bite.  Area is nonfluctuant or indurated.  No drainage or bleeding. Musculoskeletal: Normal range of motion.  Lymphadenopathy:    No cervical adenopathy.  Neurological: Pt. is alert and oriented to person, place, and time. He has normal reflexes. No cranial nerve deficit.  Exhibits normal muscle tone. Coordination normal.  Mental Status:  Alert, oriented, thought content appropriate. Speech fluent without evidence of aphasia. Able to follow 2 step commands without difficulty.  Cranial Nerves:  II:  Peripheral visual fields grossly normal, pupils equal, round, reactive to light III,IV, VI: ptosis not present, extra-ocular motions intact bilaterally  V,VII: smile symmetric, facial light touch sensation equal VIII: hearing grossly normal bilaterally  IX,X: midline uvula rise  XI: bilateral shoulder shrug equal and strong XII: midline tongue extension  Motor:  5/5 in upper and lower extremities bilaterally including strong and equal grip strength and dorsiflexion/plantar flexion Sensory: Pinprick and light touch normal in all extremities.  Deep Tendon Reflexes: 2+ and symmetric  Cerebellar: normal finger-to-nose with bilateral upper extremities Gait: normal gait and balance CV: distal pulses palpable throughout   Skin: Skin is warm and dry. No rash noted.  Pt is not diaphoretic.  Psychiatric: Pt has a normal mood and affect. Behavior is normal. Judgment and thought content normal.  Nursing note and vitals reviewed. ED Treatments / Results  Labs (all labs ordered are listed, but only abnormal results are displayed) Labs Reviewed  CBC WITH DIFFERENTIAL/PLATELET - Abnormal; Notable for the following components:      Result Value   WBC 2.3 (*)    Neutro Abs 1.2 (*)    All other components within normal limits  COMPREHENSIVE METABOLIC PANEL - Abnormal; Notable for the following components:   Sodium 131 (*)    Chloride 95 (*)    Glucose, Bld 109 (*)    Calcium 8.5 (*)    All other components within normal limits  TSH - Abnormal; Notable for the following components:   TSH 0.098 (*)    All other components within normal limits  T4, FREE - Abnormal; Notable for the following components:   Free T4 1.47 (*)    All other components within normal limits  CBG MONITORING, ED - Abnormal; Notable for the following components:   Glucose-Capillary 104 (*)    All other components  within normal limits  CULTURE, BLOOD (ROUTINE X 2)  CULTURE, BLOOD (ROUTINE X 2)  URINE CULTURE  URINALYSIS, ROUTINE W REFLEX MICROSCOPIC  MAGNESIUM  TROPONIN I  I-STAT TROPONIN, ED   EKG EKG Interpretation  Date/Time:  Friday October 04 2018 14:28:31 EDT Ventricular Rate:  65 PR Interval:    QRS Duration: 92 QT Interval:  381 QTC Calculation: 397 R Axis:   86 Text Interpretation:  Sinus rhythm Borderline right axis deviation When compared with ECG of 10/03/2018 No significant change was found Confirmed by Samuel JesterMcManus, Kathleen [phone]([id number]) on 10/04/2018 9:35:21 PM   Radiology Dg Chest 2 View  Result Date: 10/04/2018 CLINICAL DATA:  Passed out this morning.  Fatigue. EXAM: CHEST - 2 VIEW COMPARISON:  None. FINDINGS: Large hiatal hernia. The heart, hila, mediastinum, lungs, and pleura are normal. IMPRESSION: Large hiatal hernia.  No other abnormalities. Electronically Signed    By: Gerome Samavid  Williams III M.D   On: 10/04/2018 14:56   Dg Pelvis 1-2 Views  Result Date: 10/04/2018 CLINICAL DATA:  Pain after fall EXAM: PELVIS - 1-2 VIEW COMPARISON:  None. FINDINGS: Phleboliths in the pelvis. Mild degenerative changes in the hips. No fractures seen in the proximal femurs or pelvic bones. IMPRESSION: No acute abnormalities identified. Electronically Signed   By: Gerome Samavid  Williams III M.D   On: 10/04/2018 14:59   Ct Head Wo Contrast  Result Date: 10/04/2018 CLINICAL DATA:  Syncopal episode. Loss of consciousness. Weakness and feeling bad. EXAM: CT HEAD WITHOUT CONTRAST CT CERVICAL SPINE WITHOUT CONTRAST TECHNIQUE: Multidetector CT imaging of the head and cervical spine was performed following the standard protocol without intravenous contrast. Multiplanar CT image reconstructions of the cervical spine were also generated. COMPARISON:  None. FINDINGS: CT HEAD FINDINGS Brain: Age related cerebral atrophy, ventriculomegaly and periventricular white matter disease. No extra-axial fluid collections are identified. No CT findings for acute hemispheric infarction or intracranial hemorrhage. No mass lesions. The brainstem and cerebellum are normal. Vascular: Scattered vascular calcifications but no aneurysm or hyperdense vessels. Skull: No skull fracture or bone lesions. Mild hyperostosis frontalis interna. Sinuses/Orbits: The paranasal sinuses and mastoid air cells are clear. The globes are intact. Other: No scalp lesions or scalp hematoma. CT CERVICAL SPINE FINDINGS Alignment: Normal Skull base and vertebrae: Osteoporosis and degenerative cervical spondylosis with multilevel disc disease and facet disease. No acute cervical spine fracture. Soft tissues and spinal canal: No prevertebral fluid or swelling. No visible canal hematoma. Disc levels: The spinal canal is fairly generous. No significant spinal or foraminal stenosis Upper chest: The lung apices are grossly clear. Other: No neck mass or  adenopathy.  Scattered neck nodes are noted. IMPRESSION: 1. No acute intracranial findings or skull fracture. 2. Degenerative cervical spondylosis but no acute cervical spine fracture. Electronically Signed   By: Rudie MeyerP.  Gallerani M.D.   On: 10/04/2018 16:07   Ct Cervical Spine Wo Contrast  Result Date: 10/04/2018 CLINICAL DATA:  Syncopal episode. Loss of consciousness. Weakness and feeling bad. EXAM: CT HEAD WITHOUT CONTRAST CT CERVICAL SPINE WITHOUT CONTRAST TECHNIQUE: Multidetector CT imaging of the head and cervical spine was performed following the standard protocol without intravenous contrast. Multiplanar CT image reconstructions of the cervical spine were also generated. COMPARISON:  None. FINDINGS: CT HEAD FINDINGS Brain: Age related cerebral atrophy, ventriculomegaly and periventricular white matter disease. No extra-axial fluid collections are identified. No CT findings for acute hemispheric infarction or intracranial hemorrhage. No mass lesions. The brainstem and cerebellum are normal. Vascular: Scattered vascular calcifications but no aneurysm or  hyperdense vessels. Skull: No skull fracture or bone lesions. Mild hyperostosis frontalis interna. Sinuses/Orbits: The paranasal sinuses and mastoid air cells are clear. The globes are intact. Other: No scalp lesions or scalp hematoma. CT CERVICAL SPINE FINDINGS Alignment: Normal Skull base and vertebrae: Osteoporosis and degenerative cervical spondylosis with multilevel disc disease and facet disease. No acute cervical spine fracture. Soft tissues and spinal canal: No prevertebral fluid or swelling. No visible canal hematoma. Disc levels: The spinal canal is fairly generous. No significant spinal or foraminal stenosis Upper chest: The lung apices are grossly clear. Other: No neck mass or adenopathy.  Scattered neck nodes are noted. IMPRESSION: 1. No acute intracranial findings or skull fracture. 2. Degenerative cervical spondylosis but no acute cervical spine  fracture. Electronically Signed   By: Rudie Meyer M.D.   On: 10/04/2018 16:07    Procedures Procedures (including critical care time)  Medications Ordered in ED Medications  sodium chloride 0.9 % bolus 500 mL (0 mLs Intravenous Stopped 10/04/18 1706)  ketorolac (TORADOL) 30 MG/ML injection 15 mg (15 mg Intravenous Given 10/04/18 1827)  meclizine (ANTIVERT) tablet 25 mg (25 mg Oral Given 10/04/18 1827)   Initial Impression / Assessment and Plan / ED Course  I have reviewed the triage vital signs and the nursing notes.  Pertinent labs & imaging results that were available during my care of the patient were reviewed by me and considered in my medical decision making (see chart for details).  78 year old male appears otherwise well presents for evaluation generalized fatigue.  Afebrile, nonseptic, non-ill-appearing.  Had a near syncopal episode earlier this morning where he had episode of lightheaded and dizziness.  He denied preceding thunderclap headache, chest pain or shortness of breath.  Patient was seen in emergency department yesterday with negative work-up for similar complaints.  He had concern at that time for tick bite to his lower abdomen.  Bite does not look infected.  There is no drainage or bleeding.  Abd soft, nontender without rebound or guarding.  Normal neurologic exam without neurologic deficits.  Heart and lungs clear.  He did have mildly elevated initial troponin yesterday however repeat was negative.  He denied any chest pain or shortness of breath at that time.  He is unsure if he hit his head during his near syncopal event.  He denies any coagulation.  Head without trauma.  No contusions, abrasions, raccoon eyes or battle signs.  Moves all 4 extremities without difficulty.  Pelvis stable to palpation.  No shortening or rotation of legs.  Will obtain repeat labs, orthostatic vitals, CT head, CT cervical, plain film chest and plain film pelvis and reevaluate.  Labs and imaging  personally reviewed CBC without leukopenia, current pending COVID testing outpatient. No UR symptoms and negative chest xray CMP with mild hyponatremia, mild hyperglycemia at 109, no additional electrolyte, renal or liver abnormalities TSH at 0.098, Free t$ at 1.47, possible hypothyroidism as cause of his generalized fatigue? Will have patient follow up with PCP on Monday for reevaluation of this. Does not appear to be in a thyroid crisis at this time. Mag 1.7 Delta troponin negative Urinalysis negative for infection, will culture Blood cultures obtained CT head, negative CT cervical negative DG chest negative for pneumothorax, cardiomegaly, pulmonary edema, infiltrates DG pelvis negative for acute fracture. EKG without ST/T changes. Similar to previous EKG  1700: On reevaluation patient requesting food. States improvement with fluids. negative orthostatics. Low suspicion for [facility], central cause of vertigo. Patient with negative skew test and  normal neurologic exam.  No vertical or rotational nystagmus. Patient normal finger-nose and normal gait.  No slurred speech renal or weakness.  Doubt CVA or other central cause of vertigo.  History and physical consistent with peripheral vertigo symptoms.  Meclizine given by provider from yesterday however has not taken this as of today.  1900: Pending delta troponin. VS stable.  2020: On reevelaution patient requesting dc home at this time. Feels improvement with Toradol for his HA. Dizziness has resolved with Meclizine. Ambulatory without ataxic gait. Tolerating PO intake (full meal) in ED without difficulty.  2138: Negative delta troponin. Patient currently asymptomatic. Patient without arrhythmia or tachycardia while here in the department.  Patient without history of congestive heart failure, normal hematocrit, normal ECG, no shortness of breath and systolic blood pressure greater than 90; patient is low risk. Will plan for discharge home with close  cardiology/PCP follow-up.  Possibility of recurrent syncope has been discussed. Pt has remained hemodynamically stable throughout their time in the ED.  The patient has been appropriately medically screened and/or stabilized in the ED. I have low suspicion for any other emergent medical condition which would require further screening, evaluation or treatment in the ED or require inpatient management.  Patient is hemodynamically stable and in no acute distress.  Patient able to ambulate in department prior to ED.  Evaluation does not show acute pathology that would require ongoing or additional emergent interventions while in the emergency department or further inpatient treatment.  I have discussed the diagnosis with the patient and answered all questions.  Pain is been managed while in the emergency department and patient has no further complaints prior to discharge.  Patient is comfortable with plan discussed in room and is stable for discharge at this time.  I have discussed strict return precautions for returning to the emergency department.  Patient was encouraged to follow-up with PCP/specialist refer to at discharge.     Final Clinical Impressions(s) / ED Diagnoses   Final diagnoses:  Fatigue, unspecified type  Low TSH level  Dizziness    ED Discharge Orders    None       Avaeh Ewer A, PA-C 10/04/18 2141    Donnetta Hutchingook, Brian, MD 10/14/18 1554

## 2018-10-04 NOTE — ED Triage Notes (Signed)
Seen yesterday,. C/o weakness and just feeling bad.

## 2018-10-04 NOTE — ED Notes (Signed)
Pt given a meal to eat.

## 2018-10-05 LAB — ROCKY MTN SPOTTED FVR ABS PNL(IGG+IGM)
RMSF IgG: NEGATIVE
RMSF IgM: 0.21 index (ref 0.00–0.89)

## 2018-10-06 LAB — URINE CULTURE: Culture: NO GROWTH

## 2018-10-07 DIAGNOSIS — R42 Dizziness and giddiness: Secondary | ICD-10-CM | POA: Diagnosis not present

## 2018-10-07 DIAGNOSIS — R5383 Other fatigue: Secondary | ICD-10-CM | POA: Diagnosis not present

## 2018-10-09 LAB — CULTURE, BLOOD (ROUTINE X 2)
Culture: NO GROWTH
Culture: NO GROWTH
Special Requests: ADEQUATE
Special Requests: ADEQUATE

## 2018-10-10 LAB — LYME DISEASE DNA BY PCR(BORRELIA BURG): Lyme Disease(B.burgdorferi)PCR: NEGATIVE

## 2018-10-16 ENCOUNTER — Other Ambulatory Visit: Payer: Medicare HMO

## 2018-10-16 ENCOUNTER — Telehealth: Payer: Self-pay

## 2018-10-16 DIAGNOSIS — R42 Dizziness and giddiness: Secondary | ICD-10-CM | POA: Diagnosis not present

## 2018-10-16 DIAGNOSIS — R509 Fever, unspecified: Secondary | ICD-10-CM | POA: Diagnosis not present

## 2018-10-16 DIAGNOSIS — Z20822 Contact with and (suspected) exposure to covid-19: Secondary | ICD-10-CM

## 2018-10-16 DIAGNOSIS — R5383 Other fatigue: Secondary | ICD-10-CM | POA: Diagnosis not present

## 2018-10-16 DIAGNOSIS — W57XXXA Bitten or stung by nonvenomous insect and other nonvenomous arthropods, initial encounter: Secondary | ICD-10-CM | POA: Diagnosis not present

## 2018-10-16 DIAGNOSIS — J069 Acute upper respiratory infection, unspecified: Secondary | ICD-10-CM | POA: Diagnosis not present

## 2018-10-16 NOTE — Addendum Note (Signed)
Addended by: Fransisca Connors A on: 10/16/2018 03:41 PM   Modules accepted: Orders

## 2018-10-16 NOTE — Telephone Encounter (Signed)
Pt returned call and appointment scheduled and order placed.

## 2018-10-16 NOTE — Telephone Encounter (Signed)
Call received from Dr Anastasio Champion. Pt test has been lost and he want patient to be tested again.  Office 336 [phone] Fax [phone]  Call placed to patient left VM to return call for testing to [phone].

## 2018-10-17 ENCOUNTER — Ambulatory Visit: Payer: Self-pay | Admitting: *Deleted

## 2018-10-17 NOTE — Telephone Encounter (Addendum)
See previous encounter with NT regarding covid19 test swab. Patient calling for results. None resulted. Arbie Cookey, RN with [facility] is in contact with Almyra Free at [place] to track his swab. Notified patient I would call back by the end of business day, today.   Emeline Darling, Admin Coordinator in charge of the [place] testing site reports the patient's [phone] test performed on 10/01/18 is in fact now in the processing stage at [facility] and should be resulted tomorrow or Saturday, according to [place] [facility] [place]. It was explained by Almyra Free, the batch of specimens received that contained the  patient's swab had to be set aside and placed on a paper order due to computer/equipment issue, resulting in much delayed processing time.

## 2018-10-18 LAB — NOVEL CORONAVIRUS, NAA: SARS-CoV-2, NAA: NOT DETECTED

## 2018-10-31 ENCOUNTER — Ambulatory Visit (INDEPENDENT_AMBULATORY_CARE_PROVIDER_SITE_OTHER): Payer: Medicare HMO | Admitting: Otolaryngology

## 2018-10-31 DIAGNOSIS — H903 Sensorineural hearing loss, bilateral: Secondary | ICD-10-CM | POA: Diagnosis not present

## 2018-10-31 DIAGNOSIS — R42 Dizziness and giddiness: Secondary | ICD-10-CM | POA: Diagnosis not present

## 2018-11-06 DIAGNOSIS — R42 Dizziness and giddiness: Secondary | ICD-10-CM | POA: Diagnosis not present

## 2018-11-06 DIAGNOSIS — H903 Sensorineural hearing loss, bilateral: Secondary | ICD-10-CM | POA: Diagnosis not present

## 2019-01-20 ENCOUNTER — Encounter (INDEPENDENT_AMBULATORY_CARE_PROVIDER_SITE_OTHER): Payer: Self-pay | Admitting: Internal Medicine

## 2019-01-20 ENCOUNTER — Ambulatory Visit (INDEPENDENT_AMBULATORY_CARE_PROVIDER_SITE_OTHER): Payer: Medicare HMO | Admitting: Internal Medicine

## 2019-01-20 ENCOUNTER — Other Ambulatory Visit: Payer: Self-pay

## 2019-01-20 VITALS — BP 130/70 | HR 64 | Ht 73.0 in | Wt 174.6 lb

## 2019-01-20 DIAGNOSIS — E559 Vitamin D deficiency, unspecified: Secondary | ICD-10-CM

## 2019-01-20 DIAGNOSIS — E782 Mixed hyperlipidemia: Secondary | ICD-10-CM

## 2019-01-20 DIAGNOSIS — Z125 Encounter for screening for malignant neoplasm of prostate: Secondary | ICD-10-CM

## 2019-01-20 DIAGNOSIS — E785 Hyperlipidemia, unspecified: Secondary | ICD-10-CM

## 2019-01-20 DIAGNOSIS — R7303 Prediabetes: Secondary | ICD-10-CM | POA: Diagnosis not present

## 2019-01-20 HISTORY — DX: Vitamin D deficiency, unspecified: E55.9

## 2019-01-20 HISTORY — DX: Prediabetes: R73.03

## 2019-01-20 HISTORY — DX: Hyperlipidemia, unspecified: E78.5

## 2019-01-20 NOTE — Progress Notes (Signed)
   Wellness Office Visit  Subjective:  Patient ID: Joel Guerra, male    DOB: [birth year]  Age: 78 y.o. MRN: [id number]  CC: This man comes in for follow-up of his multiple medical problems including hyperlipidemia, prediabetes and vitamin D deficiency. HPI  He has not been taking statin therapy for his hyperlipidemia so is interested to know what these numbers are now.  Thankfully, he does not have a history of coronary artery disease or cerebrovascular disease. He does have a history of prediabetes with a hemoglobin A1c of 5.8% and this has not been checked for some time. He was supposed to be taking vitamin D3 supplementation but he is not. He was also interested in testosterone which I have discussed with him previously. He was also somewhat worried about testicular discomfort which he has had for several years.  It tends to occur usually when he stands up but not at other times.  There is no swelling of the testes that he sees. Past Medical History:  Diagnosis Date  . HLD (hyperlipidemia) 01/20/2019  . Prediabetes 01/20/2019  . Vitamin D deficiency disease 01/20/2019      Family History  Problem Relation Age of Onset  . Cancer Father     Social History   Social History Narrative  . Not on file     Current Meds  Medication Sig  . Cholecalciferol (VITAMIN D) 125 MCG (5000 UT) CAPS Take 5,000 Units by mouth daily.      Objective:   Today's Vitals: BP 130/70   Pulse 64   Ht 6\' 1"  (1.854 m)   Wt 79.2 kg   BMI 23.04 kg/m  Vitals with BMI 01/20/2019 10/04/2018 10/04/2018  Height 6\' 1"  - -  Weight 174 lbs 10 oz - -  BMI 11.91 - -  Systolic 478 295 -  Diastolic 70 75 -  Pulse 64 65 65     Physical Exam  He looks systemically well.  No new physical findings today.     Assessment   1. Special screening for malignant neoplasm of prostate   2. Prediabetes   3. Mixed hyperlipidemia   4. Vitamin D deficiency disease       Tests ordered Orders Placed This  Encounter  Procedures  . COMPLETE METABOLIC PANEL WITH GFR  . Hemoglobin A1c  . PSA  . Lipid panel     Plan: 1. Blood work is ordered as above.  He was interested in screening for prostate cancer. 2. We did discuss testosterone therapy, the pros and cons, benefits and side effects and mode of administration.  He will think about this. 3. As far as his testicular  discomfort is concerned, which he has had for several years, he will let me know whether he wants me to refer him to urology. 4. Further recommendations will depend on blood results and I will have him come in for an annual physical with Judson Roch in about 3 months. 5. Today I spent 25 to 30 minutes with this patient face-to-face, more than 50% of the time was involved in discussing his various questions above.     Doree Albee, MD

## 2019-01-21 ENCOUNTER — Encounter (INDEPENDENT_AMBULATORY_CARE_PROVIDER_SITE_OTHER): Payer: Self-pay | Admitting: Internal Medicine

## 2019-01-21 LAB — COMPLETE METABOLIC PANEL WITH GFR
AG Ratio: 1.6 (calc) (ref 1.0–2.5)
ALT: 6 U/L — ABNORMAL LOW (ref 9–46)
AST: 13 U/L (ref 10–35)
Albumin: 3.7 g/dL (ref 3.6–5.1)
Alkaline phosphatase (APISO): 60 U/L (ref 35–144)
BUN: 16 mg/dL (ref 7–25)
CO2: 28 mmol/L (ref 20–32)
Calcium: 9.4 mg/dL (ref 8.6–10.3)
Chloride: 107 mmol/L (ref 98–110)
Creat: 0.78 mg/dL (ref 0.70–1.11)
GFR, Est African American: 99 mL/min/{1.73_m2} (ref >=60)
GFR, Est Non African American: 85 mL/min/{1.73_m2} (ref >=60)
Globulin: 2.3 g/dL (calc) (ref 1.9–3.7)
Glucose, Bld: 103 mg/dL — ABNORMAL HIGH (ref 65–99)
Potassium: 4.6 mmol/L (ref 3.5–5.3)
Sodium: 142 mmol/L (ref 135–146)
Total Bilirubin: 0.5 mg/dL (ref 0.2–1.2)
Total Protein: 6 g/dL — ABNORMAL LOW (ref 6.1–8.1)

## 2019-01-21 LAB — LIPID PANEL
Cholesterol: 169 mg/dL (ref <200)
HDL: 50 mg/dL (ref >=40)
LDL Cholesterol (Calc): 102 mg/dL (calc) — ABNORMAL HIGH
Non-HDL Cholesterol (Calc): 119 mg/dL (calc) (ref <130)
Total CHOL/HDL Ratio: 3.4 (calc) (ref <5.0)
Triglycerides: 81 mg/dL (ref <150)

## 2019-01-21 LAB — HEMOGLOBIN A1C
Hgb A1c MFr Bld: 5.5 % of total Hgb (ref <5.7)
Mean Plasma Glucose: 111 (calc)
eAG (mmol/L): 6.2 (calc)

## 2019-01-21 LAB — PSA: PSA: 2.6 ng/mL (ref <=4.0)

## 2019-02-10 ENCOUNTER — Other Ambulatory Visit: Payer: Self-pay

## 2019-02-10 ENCOUNTER — Ambulatory Visit (INDEPENDENT_AMBULATORY_CARE_PROVIDER_SITE_OTHER): Payer: Medicare HMO

## 2019-02-10 DIAGNOSIS — Z23 Encounter for immunization: Secondary | ICD-10-CM

## 2019-02-11 ENCOUNTER — Other Ambulatory Visit (INDEPENDENT_AMBULATORY_CARE_PROVIDER_SITE_OTHER): Payer: Self-pay | Admitting: Internal Medicine

## 2019-02-11 MED ORDER — SILDENAFIL CITRATE 50 MG PO TABS: 50.0000 mg | ORAL_TABLET | Freq: Every day | ORAL | 3 refills | Status: DC | PRN

## 2019-05-01 ENCOUNTER — Other Ambulatory Visit: Payer: Self-pay

## 2019-05-01 ENCOUNTER — Ambulatory Visit (INDEPENDENT_AMBULATORY_CARE_PROVIDER_SITE_OTHER): Payer: Medicare HMO | Admitting: Nurse Practitioner

## 2019-05-01 ENCOUNTER — Encounter (INDEPENDENT_AMBULATORY_CARE_PROVIDER_SITE_OTHER): Payer: Self-pay | Admitting: Nurse Practitioner

## 2019-05-01 VITALS — BP 139/90 | HR 56 | Temp 97.9°F | Ht 73.5 in | Wt 181.4 lb

## 2019-05-01 DIAGNOSIS — Z712 Person consulting for explanation of examination or test findings: Secondary | ICD-10-CM

## 2019-05-01 DIAGNOSIS — Z0001 Encounter for general adult medical examination with abnormal findings: Secondary | ICD-10-CM

## 2019-05-01 DIAGNOSIS — H538 Other visual disturbances: Secondary | ICD-10-CM

## 2019-05-01 DIAGNOSIS — E559 Vitamin D deficiency, unspecified: Secondary | ICD-10-CM

## 2019-05-01 DIAGNOSIS — N50819 Testicular pain, unspecified: Secondary | ICD-10-CM | POA: Diagnosis not present

## 2019-05-01 NOTE — Patient Instructions (Addendum)
Thank you for choosing [facility] as your medical provider! If you have any questions or concerns regarding your health care, please do not hesitate to call our office.  Continue medications as prescribed.   If you need the tetanus shot or would like the shingles vaccine let us know.  You are up-to-date with all recommended health maintenance screenings at this time. If you start to feel testicular discomfort again would like to be seen by a specialist for further evaluation, please let me know.  Please follow-up as scheduled in 1 month for annual wellness visit over the phone and then again in 3 months for blood work and regular follow-up. We look forward to seeing you again soon! Have a great Valentine's Day!!  At [facility] we value your feedback. You may receive a survey about your visit today. Please share your experience as we strive to create trusting relationships with our patients to provide genuine, compassionate, quality care.  We appreciate your understanding and patience as we review any laboratory studies, imaging, and other diagnostic tests that are ordered as we care for you. We do our best to address any and all results in a timely manner. If you do not hear about test results within 1 week, please do not hesitate to contact us. If we referred you to a specialist during your visit or ordered imaging testing, contact the office if you have not been contacted to be scheduled within 1 weeks.  We also encourage the use of MyChart, which contains your medical information for your review as well. If you are not enrolled in this feature, an access code is on this after visit summary for your convenience. Thank you for allowing [place] to be involved in your care.

## 2019-05-01 NOTE — Assessment & Plan Note (Signed)
I encouraged him to let us know if he would like a tetanus shot and discussed that if he were to have any kind of penetrating wound or burn but he should have a tetanus shot at that time.  He tells me he understands.  I encouraged him to call his pharmacist if you would like the shingles vaccine.  He does not want to do sexual transmitted infection screening today.  He does not require tobacco cessation conversation.  Depression screen was positive but PHQ-9 was 2/27, and he did mention that he will often get mildly depressed in the wintertime but this improves in the summer.  Appears to be low fall risk at this time.  Does not qualify for lung cancer screening.  We will collect screening CBC at his next office visit.

## 2019-05-01 NOTE — Assessment & Plan Note (Signed)
I reviewed his lab work from September 2020 and discussed these results with him.  He is probably due for screening CBC and vitamin D level.  However he would like to come back to our office and have this drawn in the future when our phlebotomist is available as she is not available today.  He does not want to go to [facility] across the street from the hospital.  I will place orders for these labs and we will get these collected as next office visit.

## 2019-05-01 NOTE — Progress Notes (Signed)
Subjective:  Patient ID: Joel Guerra, male    DOB: [birth year]  Age: 79 y.o. MRN: [id number]  CC:  Chief Complaint  Patient presents with  . Annual Exam      HPI  This patient arrives today for his annual physical exam.  Immunizations: He had his flu shot this year.  He tells me he has had both pneumonia vaccines but is not sure how long ago this was but does believe it was after the age of 21.  He would be due for tetanus shot, but would prefer to not take this unless he needs it.  He is not interested in the shingles vaccine.  He is interested in the COVID-19 vaccine once it becomes available.  Screenings: He is not interested in sexual transmitted infection screening at this time.  He does not qualify for lung cancer screening.  He is due for depression screening and fall screening.  He is not a smoker.  Of note I see that he has reported testicular discomfort in the past.  I did discuss this with him and he tells me he has had intermittent testicular pain on and off the last 30 to 40 years.  He tells me that he has not had any pain recently, and notices it when he has been standing up for long periods of time.  He denies any fevers and the pain does not radiate anywhere.  He would not like to see urology or have further investigation done currently.  He also had blood work collected in September 2020, and I plan on going over this with him today as well.  During review of systems it was noted that he is having some issues with vision in his right eye.  He does have a history of retinal detachment in that eye.  He tells me the symptoms are not to the same as his previous retinal detachment.  He tells me that he sometimes will have intermittent blurry vision, but this does not last long.  He also tells me he has had cataract surgery completed in both eyes in the past.  He has not seen a eye doctor in the last year mostly because of the coronavirus pandemic.  Past Medical History:   Diagnosis Date  . HLD (hyperlipidemia) 01/20/2019  . Prediabetes 01/20/2019  . Vitamin D deficiency disease 01/20/2019      Family History  Problem Relation Age of Onset  . Cancer Father     Social History   Social History Narrative  . Not on file   Social History   Tobacco Use  . Smoking status: Never Smoker  . Smokeless tobacco: Never Used  Substance Use Topics  . Alcohol use: Yes     Current Meds  Medication Sig  . cetirizine (ZYRTEC) 10 MG tablet Take 10 mg by mouth daily as needed for allergies.  . Cholecalciferol (VITAMIN D) 125 MCG (5000 UT) CAPS Take 5,000 Units by mouth daily.   . meclizine (ANTIVERT) 25 MG tablet Take 1 tablet (25 mg total) by mouth 3 (three) times daily as needed for dizziness.  Bertram Gala Glycol-Propyl Glycol (SYSTANE) 0.4-0.3 % SOLN Apply 1 drop to eye daily.  . sildenafil (VIAGRA) 50 MG tablet Take 1 tablet (50 mg total) by mouth daily as needed for erectile dysfunction.    ROS:  Review of Systems  Constitutional: Negative for fever and malaise/fatigue.  Eyes: Positive for blurred vision (right eye worse than left).  Respiratory: Negative.  Cardiovascular: Negative.   Gastrointestinal: Positive for heartburn. Negative for abdominal pain, blood in stool, nausea and vomiting.  Neurological: Negative for dizziness, sensory change, weakness and headaches.  Psychiatric/Behavioral: Positive for depression. Negative for suicidal ideas.     Objective:   Today's Vitals: BP 139/90 (BP Location: Right Arm, Patient Position: Sitting, Cuff Size: Normal)   Pulse (!) 56   Temp 97.9 F (36.6 C) (Temporal)   Ht 6' 1.5" (1.867 m)   Wt 181 lb 6.4 oz (82.3 kg)   SpO2 98%   BMI 23.61 kg/m  Vitals with BMI 05/01/2019 01/20/2019 10/04/2018  Height 6' 1.5" 6\' 1"  -  Weight 181 lbs 6 oz 174 lbs 10 oz -  BMI 29.92 42.68 -  Systolic 341 962 229  Diastolic 90 70 75  Pulse 56 64 65     Physical Exam Vitals reviewed.  Constitutional:      General:  He is not in acute distress.    Appearance: Normal appearance. He is not ill-appearing.  HENT:     Head: Normocephalic and atraumatic.     Right Ear: Tympanic membrane, ear canal and external ear normal.     Left Ear: Tympanic membrane, ear canal and external ear normal.  Eyes:     General: No scleral icterus.    Extraocular Movements: Extraocular movements intact.     Conjunctiva/sclera: Conjunctivae normal.     Pupils: Pupils are equal, round, and reactive to light.  Neck:     Vascular: No carotid bruit.  Cardiovascular:     Rate and Rhythm: Normal rate and regular rhythm.     Pulses: Normal pulses.     Heart sounds: Normal heart sounds.  Pulmonary:     Effort: Pulmonary effort is normal.     Breath sounds: Normal breath sounds.  Abdominal:     General: Bowel sounds are normal. There is no distension.     Palpations: There is no mass.     Tenderness: There is no abdominal tenderness.     Hernia: No hernia is present.  Musculoskeletal:        General: No swelling or tenderness.     Cervical back: Normal range of motion and neck supple. No rigidity.  Lymphadenopathy:     Cervical: No cervical adenopathy.  Skin:    General: Skin is warm and dry.  Neurological:     General: No focal deficit present.     Mental Status: He is alert and oriented to person, place, and time.     Cranial Nerves: No cranial nerve deficit.     Sensory: No sensory deficit.     Motor: No weakness.     Gait: Gait normal.  Psychiatric:        Mood and Affect: Mood normal.        Behavior: Behavior normal.        Judgment: Judgment normal.     PHQ9: 2/27 Falls: low fall risk     Assessment   No diagnosis found.    Tests ordered No orders of the defined types were placed in this encounter.    Plan: Please see assessment and plan per problem list below.   No orders of the defined types were placed in this encounter.   Patient to follow-up in 1 month for annual wellness visit, and  again in 3 months for office visit.Marland Kitchen  Ailene Ards, NP

## 2019-05-01 NOTE — Assessment & Plan Note (Signed)
He will continue on his current supplement for now.  We will collect a vitamin D level at next office visit.

## 2019-05-01 NOTE — Assessment & Plan Note (Signed)
I encouraged him to let us know if this symptom returns and if so we could send him to urology for further evaluation.  He tells me he understands.

## 2019-05-01 NOTE — Assessment & Plan Note (Signed)
I recommended he follow-up with his ophthalmologist for further evaluation of this.  He tells me he will once he feels the pandemic is improved.

## 2019-05-21 ENCOUNTER — Other Ambulatory Visit (INDEPENDENT_AMBULATORY_CARE_PROVIDER_SITE_OTHER): Payer: Self-pay | Admitting: Internal Medicine

## 2019-05-21 DIAGNOSIS — N390 Urinary tract infection, site not specified: Secondary | ICD-10-CM

## 2019-05-21 DIAGNOSIS — M545 Low back pain, unspecified: Secondary | ICD-10-CM

## 2019-05-21 NOTE — Progress Notes (Signed)
urine

## 2019-05-22 ENCOUNTER — Telehealth (INDEPENDENT_AMBULATORY_CARE_PROVIDER_SITE_OTHER): Payer: Self-pay

## 2019-05-22 LAB — CBC
HCT: 41.3 % (ref 38.5–50.0)
Hemoglobin: 14 g/dL (ref 13.2–17.1)
MCH: 31.3 pg (ref 27.0–33.0)
MCHC: 33.9 g/dL (ref 32.0–36.0)
MCV: 92.2 fL (ref 80.0–100.0)
MPV: 9.7 fL (ref 7.5–12.5)
Platelets: 254 10*3/uL (ref 140–400)
RBC: 4.48 10*6/uL (ref 4.20–5.80)
RDW: 13.4 % (ref 11.0–15.0)
WBC: 5.1 10*3/uL (ref 3.8–10.8)

## 2019-05-22 LAB — URINALYSIS
Bilirubin Urine: NEGATIVE
Glucose, UA: NEGATIVE
Hgb urine dipstick: NEGATIVE
Ketones, ur: NEGATIVE
Leukocytes,Ua: NEGATIVE
Nitrite: NEGATIVE
Protein, ur: NEGATIVE
Specific Gravity, Urine: 1.005 (ref 1.001–1.03)
pH: 6.5 (ref 5.0–8.0)

## 2019-05-22 LAB — BASIC METABOLIC PANEL
BUN: 16 mg/dL (ref 7–25)
CO2: 26 mmol/L (ref 20–32)
Calcium: 9.3 mg/dL (ref 8.6–10.3)
Chloride: 106 mmol/L (ref 98–110)
Creat: 0.9 mg/dL (ref 0.70–1.11)
Glucose, Bld: 100 mg/dL — ABNORMAL HIGH (ref 65–99)
Potassium: 4.9 mmol/L (ref 3.5–5.3)
Sodium: 140 mmol/L (ref 135–146)

## 2019-05-22 LAB — URINE CULTURE: Result:: NO GROWTH

## 2019-05-22 LAB — VITAMIN D 25 HYDROXY (VIT D DEFICIENCY, FRACTURES): Vit D, 25-Hydroxy: 24 ng/mL — ABNORMAL LOW (ref 30–100)

## 2019-05-22 NOTE — Telephone Encounter (Signed)
Pt LVM to see if the test results had come back

## 2019-05-26 ENCOUNTER — Telehealth (INDEPENDENT_AMBULATORY_CARE_PROVIDER_SITE_OTHER): Payer: Self-pay

## 2019-05-26 NOTE — Telephone Encounter (Signed)
He was feeling some pressure when urination. No burning or anything;felt difficult stated a few weeks ago. Pt Just wanted to check.

## 2019-05-26 NOTE — Telephone Encounter (Signed)
Do you know why his urine was collected for analysis?  When I review my last note, there was no notation that he was having urinary symptoms.  However I see that I did order some blood work to be drawn.  His urine is clear without signs of urinary tract infection or bleeding.  He is a bit deficient in vitamin D.  Other lab work is unremarkable.  I will discuss his blood results at his next office visit later this week.  If he is having urinary symptoms like pain with urination, frequent urination, etc please let me know.

## 2019-05-26 NOTE — Telephone Encounter (Signed)
Okay, you can let him know that his urine did not show any abnormalities.  I will need to discuss all of his other blood work when I see him in the office later this week.  Thank you.

## 2019-05-30 ENCOUNTER — Encounter (INDEPENDENT_AMBULATORY_CARE_PROVIDER_SITE_OTHER): Payer: Self-pay | Admitting: Nurse Practitioner

## 2019-05-30 ENCOUNTER — Ambulatory Visit (INDEPENDENT_AMBULATORY_CARE_PROVIDER_SITE_OTHER): Payer: Medicare HMO | Admitting: Nurse Practitioner

## 2019-05-30 ENCOUNTER — Other Ambulatory Visit: Payer: Self-pay

## 2019-05-30 VITALS — Ht 73.5 in | Wt 181.0 lb

## 2019-05-30 DIAGNOSIS — Z0000 Encounter for general adult medical examination without abnormal findings: Secondary | ICD-10-CM

## 2019-05-30 NOTE — Patient Instructions (Signed)
Thank you for choosing [facility] as your medical provider! If you have any questions or concerns regarding your health care, please do not hesitate to call our office.  We completed your annual wellness visit today.  Immunizations: 1.  Flu: You will be due for the flu vaccine again next flu season. 2.  Pneumonia: Please call your previous doctor to determine if you had already gotten the Pneumovax pneumonia vaccine.  This is also known as PPSV23.  If you have not gotten this vaccine we can administer this to you next time you are seen in our office. 3.  Shingles: If you decide you want the shingles vaccine please discuss this with your pharmacist. 4.  COVID-19: Consider getting the COVID-19 vaccine once it becomes available to you  Health maintenance: 1.  Sexually transmitted infection screening: If you would like to be screened for sexual transmitted infections please let us know 2.  Depression: You will be due for depression screening again next year 3.  Fall screening: Your fall risk assessment is low.  However if you were to fall and hit your head please follow-up in the emergency department for further evaluation and to rule out bleeding on the brain.  Advanced care planning: Please try to locate your advance care plan document.  If you can we will like to take a copy and upload this to our chart.  If your low to mid back pain is not improving by the next time we see you, we can send you for further evaluation probably with imaging studies.  Please let us know if this continues to bother you or worsens in any way.  Please follow-up as scheduled in 2 months. We look forward to seeing you again soon!   At [facility] we value your feedback. You may receive a survey about your visit today. Please share your experience as we strive to create trusting relationships with our patients to provide genuine, compassionate, quality care.  We appreciate your understanding and  patience as we review any laboratory studies, imaging, and other diagnostic tests that are ordered as we care for you. We do our best to address any and all results in a timely manner. If you do not hear about test results within 1 week, please do not hesitate to contact us. If we referred you to a specialist during your visit or ordered imaging testing, contact the office if you have not been contacted to be scheduled within 1 weeks.  We also encourage the use of MyChart, which contains your medical information for your review as well. If you are not enrolled in this feature, an access code is on this after visit summary for your convenience. Thank you for allowing [place] to be involved in your care.

## 2019-05-30 NOTE — Progress Notes (Signed)
Due to national recommendations of social distancing related to the COVID19 pandemic, an audio/visual tele-health visit was felt to be the most appropriate encounter type for this patient today. I connected with  Joel Guerra on 05/30/19 utilizing audio/visual technology and verified that I am speaking with the correct person using two identifiers. The patient was located at their home, and I was located at my home office during the encounter. I discussed the limitations of evaluation and management by telemedicine. The patient expressed understanding and agreed to proceed.    Subjective:   Joel Guerra is a 79 y.o. male who presents for Medicare Annual/Subsequent preventive examination.   Cardiac Risk Factors include: advanced age (>62men, >60 women);male gender     Objective:    Vitals: Ht 6' 1.5" (1.867 m)   Wt 181 lb (82.1 kg)   BMI 23.56 kg/m   Body mass index is 23.56 kg/m.  Advanced Directives 05/30/2019 10/04/2018 10/03/2018  Does Patient Have a Medical Advance Directive? Yes No No  Type of Advance Directive Out of facility DNR (pink MOST or yellow form) - -  Does patient want to make changes to medical advance directive? No - Patient declined - -  Would patient like information on creating a medical advance directive? - No - Patient declined -    Tobacco Social History   Tobacco Use  Smoking Status Never Smoker  Smokeless Tobacco Never Used     Counseling given: Not Answered   Clinical Intake:  Pre-visit preparation completed: Yes  Pain : 0-10 Pain Score: 2  Pain Type: Acute pain Pain Location: Back Pain Orientation: Right, Upper Pain Radiating Towards: none Pain Descriptors / Indicators: Dull Pain Onset: 1 to 4 weeks ago Pain Frequency: Constant Pain Relieving Factors: nothing tried Effect of Pain on Daily Activities: none  Pain Relieving Factors: nothing tried  Nutritional Status: BMI of 19-24  Normal Diabetes: No  How often do you need to have someone  help you when you read instructions, pamphlets, or other written materials from your doctor or pharmacy?: 3 - Sometimes What is the last grade level you completed in school?: 12th grade  Interpreter Needed?: No  Information entered by :: Jiles Prows, NP-C  Past Medical History:  Diagnosis Date  . HLD (hyperlipidemia) 01/20/2019  . Prediabetes 01/20/2019  . Retinal detachment    Right  . Vitamin D deficiency disease 01/20/2019   Past Surgical History:  Procedure Laterality Date  . ANKLE ARTHROPLASTY  1970  . cataract Bilateral   . FOOT SURGERY    . LACRIMAL GLAND BIOPSY    . other  2008   Parotid gland stone removal   . RETINAL DETACHMENT SURGERY Right    Family History  Problem Relation Age of Onset  . Cancer Father        laryngal cancer  . Diabetes Sister   . Osteoporosis Brother   . Heart disease Other    Social History   Socioeconomic History  . Marital status: Significant Other    Spouse name: Not on file  . Number of children: Not on file  . Years of education: Not on file  . Highest education level: Not on file  Occupational History  . Not on file  Tobacco Use  . Smoking status: Never Smoker  . Smokeless tobacco: Never Used  Substance and Sexual Activity  . Alcohol use: Yes    Alcohol/week: 2.0 standard drinks    Types: 2 Cans of beer per week    Comment:  Every day  . Drug use: Never  . Sexual activity: Not on file  Other Topics Concern  . Not on file  Social History Narrative  . Not on file   Social Determinants of Health   Financial Resource Strain:   . Difficulty of Paying Living Expenses: Not on file  Food Insecurity:   . Worried About Programme researcher, broadcasting/film/video in the Last Year: Not on file  . Ran Out of Food in the Last Year: Not on file  Transportation Needs:   . Lack of Transportation (Medical): Not on file  . Lack of Transportation (Non-Medical): Not on file  Physical Activity:   . Days of Exercise per Week: Not on file  . Minutes of Exercise  per Session: Not on file  Stress:   . Feeling of Stress : Not on file  Social Connections:   . Frequency of Communication with Friends and Family: Not on file  . Frequency of Social Gatherings with Friends and Family: Not on file  . Attends Religious Services: Not on file  . Active Member of Clubs or Organizations: Not on file  . Attends Banker Meetings: Not on file  . Marital Status: Not on file    Outpatient Encounter Medications as of 05/30/2019  Medication Sig  . Ascorbic Acid (VITAMIN C) 500 MG CAPS Take 1 capsule by mouth daily as needed.  . Cholecalciferol (VITAMIN D) 125 MCG (5000 UT) CAPS Take 5,000 Units by mouth daily.   Marland Kitchen loratadine (CLARITIN) 10 MG tablet Take 10 mg by mouth daily as needed for allergies.  Marland Kitchen meclizine (ANTIVERT) 25 MG tablet Take 1 tablet (25 mg total) by mouth 3 (three) times daily as needed for dizziness.  Bertram Gala Glycol-Propyl Glycol (SYSTANE) 0.4-0.3 % SOLN Apply 1 drop to eye daily.  . sildenafil (VIAGRA) 50 MG tablet Take 1 tablet (50 mg total) by mouth daily as needed for erectile dysfunction.   No facility-administered encounter medications on file as of 05/30/2019.    Activities of Daily Living In your present state of health, do you have any difficulty performing the following activities: 05/30/2019  Hearing? N  Vision? N  Difficulty concentrating or making decisions? N  Walking or climbing stairs? N  Dressing or bathing? N  Doing errands, shopping? N  Preparing Food and eating ? N  Using the Toilet? N  In the past six months, have you accidently leaked urine? N  Do you have problems with loss of bowel control? N  Managing your Medications? N  Managing your Finances? N  Housekeeping or managing your Housekeeping? N  Some recent data might be hidden    Patient Care Team: Wilson Singer, MD as PCP - General (Internal Medicine)   Assessment:   This is a routine wellness examination for [place].  Exercise Activities and  Dietary recommendations Current Exercise Habits: The patient does not participate in regular exercise at present, Exercise limited by: None identified  Goals    . Increase physical activity       Fall Risk Fall Risk  05/30/2019 05/01/2019  Falls in the past year? 0 0  Number falls in past yr: 0 0  Injury with Fall? 0 0  Risk for fall due to : No Fall Risks -  Follow up Falls prevention discussed;Education provided Education provided;Falls prevention discussed   Is the patient's home free of loose throw rugs in walkways, pet beds, electrical cords, etc?   yes  Grab bars in the bathroom? no      Handrails on the stairs?   yes      Adequate lighting?   yes  Timed Get Up and Go Performed: Not completely resolved visit was conducted remotely  Depression Screen PHQ 2/9 Scores 05/30/2019 05/01/2019 05/01/2019  PHQ - 2 Score 0 1 1  PHQ- 9 Score - 2 -    Cognitive Function     6CIT Screen 05/30/2019  What Year? 0 points  What month? 0 points  What time? 0 points  Count back from 20 0 points  Months in reverse 0 points  Repeat phrase 2 points  Total Score 2    Immunization History  Administered Date(s) Administered  . Fluad Quad(high Dose 65+) 02/10/2019    Qualifies for Shingles Vaccine?  Yes  Screening Tests Health Maintenance  Topic Date Due  . TETANUS/TDAP  04/30/2020 (Originally 10/04/1959)  . PNA vac Low Risk Adult (1 of 2 - PCV13) 04/30/2020 (Originally 10/05/2003)  . INFLUENZA VACCINE  Completed   Cancer Screenings: Lung: Low Dose CT Chest recommended if Age 51-80 years, 30 pack-year currently smoking OR have quit w/in 15years. Patient does not qualify. Colorectal: Does not qualify        Plan:   Patient will be due for flu shot next flu season.  He believes he may have already had the Pneumovax injection at his previous doctor's office.  He is not sure but will consider calling them to double check.  He does not want the shingles vaccine.  He is waiting to get the  COVID-19 vaccine.  He does not require tobacco cessation conversation because he is not a smoker.  He has already declined a sexual transmitted infection screening at his annual physical exam earlier this year.  Depression screening was negative today.  Fall risk is low, I did tell him that if he were to fall and hit his head he should be seen in the emergency department to rule out bleeding on the brain.  He does not qualify for lung cancer screening.  We did discuss advance care planning.  He believes he does have an advanced care plan in place, he thinks it is the most form.  Per chart review I do not see that we have a copy on file.  I have asked him to locate his copy and bring 1 in so that we can uploaded to our file.  He also mentions to me that he has mid to low right back pain.  Blood work was collected as well as urinalysis all of which came back within normal limits.  He tells me the pain is starting to improve.  Is been going on for about 1 month.  I offered to send him for further imaging if he is concerned, however I recommended that we hold off on imaging as the pain is improving.  He tells me he would like to hold off for now as well.  He tells me if his pain worsens or is not better by his next follow-up appointment in April he will let us know and we can send him for further evaluation at that point.  I have personally reviewed and noted the following in the patient's chart:   . Medical and social history . Use of alcohol, tobacco or illicit drugs  . Current medications and supplements . Functional ability and status . Nutritional status . Physical activity . Advanced directives . List of other physicians . Hospitalizations,  surgeries, and ER visits in previous 12 months . Vitals . Screenings to include cognitive, depression, and falls . Referrals and appointments  In addition, I have reviewed and discussed with patient certain preventive protocols, quality metrics, and best  practice recommendations. A written personalized care plan for preventive services as well as general preventive health recommendations were provided to patient.     Elenore Paddy, NP  05/30/2019

## 2019-06-18 ENCOUNTER — Other Ambulatory Visit: Payer: Self-pay

## 2019-06-18 ENCOUNTER — Ambulatory Visit
Admission: EM | Admit: 2019-06-18 | Discharge: 2019-06-18 | Disposition: A | Discharge status: Home / Self Care | Payer: Medicare HMO | Attending: Emergency Medicine | Admitting: Emergency Medicine

## 2019-06-18 ENCOUNTER — Ambulatory Visit (INDEPENDENT_AMBULATORY_CARE_PROVIDER_SITE_OTHER): Payer: Medicare HMO | Admitting: Internal Medicine

## 2019-06-18 DIAGNOSIS — R42 Dizziness and giddiness: Secondary | ICD-10-CM

## 2019-06-18 DIAGNOSIS — R030 Elevated blood-pressure reading, without diagnosis of hypertension: Secondary | ICD-10-CM

## 2019-06-18 LAB — POCT URINALYSIS DIP (MANUAL ENTRY)
Bilirubin, UA: NEGATIVE
Blood, UA: NEGATIVE
Glucose, UA: NEGATIVE mg/dL
Ketones, POC UA: NEGATIVE mg/dL
Leukocytes, UA: NEGATIVE
Nitrite, UA: NEGATIVE
Protein Ur, POC: NEGATIVE mg/dL
Spec Grav, UA: 1.01 (ref 1.010–1.025)
Urobilinogen, UA: 0.2 E.U./dL
pH, UA: 7 (ref 5.0–8.0)

## 2019-06-18 LAB — POCT FASTING CBG KUC MANUAL ENTRY: POCT Glucose (KUC): 118 mg/dL — AB (ref 70–99)

## 2019-06-18 NOTE — ED Triage Notes (Addendum)
Pt presents to UC w/ c/o hypertension and lightheadness for a few days. Pt states this morning, BP was 188/88. Pt does not take any BP meds. Pt has had some sinus drainage for a week.

## 2019-06-18 NOTE — Discharge Instructions (Signed)
Blood pressure 130/74, this is great! Urine did not show signs of infection, dehydration or blood Blood glucose 118.  This is also great! Orthostatic vital signs were within normal limited CBC and BMP drawn.  We will follow up with you regarding abnormal results Rest and incorporate drinking some water Begin OTC allergy medication and/or Flonase to help with sinus congestion, which may be contributing to your lightheadedness/ dizziness Follow up with Dr. Karilyn Cota as needed Return or go the ED if you have any new or worsening symptoms including high blood pressure, chest pain, shortness of breath, abdominal pain, changes in bowel or bladder habits, passing out/ fainting, weakness in arms or legs, slurred speech, facial droop, etc..Marland Kitchen

## 2019-06-18 NOTE — ED Provider Notes (Addendum)
[facility]   [id number] 06/18/19 Arrival Time: 96  CC: DIZZINESS  SUBJECTIVE:  Zygmund Passero is a 79 y.o. male who presents with complaint of dizziness,and lightheadedness that began this morning.  States he checked blood pressure this morning and was 188/88.  130/74 in office.  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  Describes the dizziness as intermittent and as if the, "neck won't hold head up." States that it is constant, and has lasted about 2-4 hours.  Has NOT tried OTC medication.  Denies aggravating factors. Admits to previous symptoms in the past that resolved without intervention.  Denies fever, chills, nausea, vomiting, hearing changes, tinnitus, ear pain, chest pain, syncope, SOB, weakness, slurred speech, memory or emotional changes, facial drooping/ asymmetry, incoordination, numbness or tingling, abdominal pain, changes in bowel or bladder habits, bleeding gums, hematochezia, melena, hematuria.     ROS: As per HPI.  All other pertinent ROS negative.    Past Medical History:  Diagnosis Date  . HLD (hyperlipidemia) 01/20/2019  . Prediabetes 01/20/2019  . Retinal detachment    Right  . Vitamin D deficiency disease 01/20/2019   Past Surgical History:  Procedure Laterality Date  . ANKLE ARTHROPLASTY  1970  . cataract Bilateral   . FOOT SURGERY    . LACRIMAL GLAND BIOPSY    . other  2008   Parotid gland stone removal   . RETINAL DETACHMENT SURGERY Right    No Known Allergies No current facility-administered medications on file prior to encounter.   Current Outpatient Medications on File Prior to Encounter  Medication Sig Dispense Refill  . Ascorbic Acid (VITAMIN C) 500 MG CAPS Take 1 capsule by mouth daily as needed.    . Cholecalciferol (VITAMIN D) 125 MCG (5000 UT) CAPS Take 5,000 Units by mouth daily.     Marland Kitchen loratadine (CLARITIN) 10 MG tablet Take 10 mg by mouth daily as  needed for allergies.    Marland Kitchen meclizine (ANTIVERT) 25 MG tablet Take 1 tablet (25 mg total) by mouth 3 (three) times daily as needed for dizziness. 30 tablet 0  . Polyethyl Glycol-Propyl Glycol (SYSTANE) 0.4-0.3 % SOLN Apply 1 drop to eye daily.    . sildenafil (VIAGRA) 50 MG tablet Take 1 tablet (50 mg total) by mouth daily as needed for erectile dysfunction. 6 tablet 3   Social History   Socioeconomic History  . Marital status: Significant Other    Spouse name: Not on file  . Number of children: Not on file  . Years of education: Not on file  . Highest education level: Not on file  Occupational History  . Not on file  Tobacco Use  . Smoking status: Never Smoker  . Smokeless tobacco: Never Used  Substance and Sexual Activity  . Alcohol use: Yes    Alcohol/week: 2.0 standard drinks    Types: 2 Cans of beer per week    Comment: Every day  . Drug use: Never  . Sexual activity: Not on file  Other Topics Concern  . Not on file  Social History Narrative  . Not on file   Social Determinants of Health   Financial Resource Strain:   . Difficulty of Paying Living Expenses: Not on file  Food Insecurity:   . Worried About Charity fundraiser in the Last Year: Not on file  . Ran Out of Food in the Last Year: Not on file  Transportation  Needs:   . Lack of Transportation (Medical): Not on file  . Lack of Transportation (Non-Medical): Not on file  Physical Activity:   . Days of Exercise per Week: Not on file  . Minutes of Exercise per Session: Not on file  Stress:   . Feeling of Stress : Not on file  Social Connections:   . Frequency of Communication with Friends and Family: Not on file  . Frequency of Social Gatherings with Friends and Family: Not on file  . Attends Religious Services: Not on file  . Active Member of Clubs or Organizations: Not on file  . Attends Banker Meetings: Not on file  . Marital Status: Not on file  Intimate Partner Violence:   . Fear of  Current or Ex-Partner: Not on file  . Emotionally Abused: Not on file  . Physically Abused: Not on file  . Sexually Abused: Not on file   Family History  Problem Relation Age of Onset  . Cancer Father        laryngal cancer  . Diabetes Sister   . Osteoporosis Brother   . Heart disease Other     OBJECTIVE:  Vitals:   06/18/19 1119 06/18/19 1122  BP:  130/74  Pulse:  60  Resp: 16 16  Temp:  97.8 F (36.6 C)  TempSrc: Oral Oral  SpO2:  94%    Orthostatic VS for the past 24 hrs (Last 3 readings):  BP- Lying Pulse- Lying BP- Sitting Pulse- Sitting BP- Standing at 0 minutes Pulse- Standing at 0 minutes  06/18/19 1158 137/72 58 138/67 63 137/62 64    General appearance: alert; no distress Eyes: PERRLA; EOMI; conjunctiva normal HENT: normocephalic; atraumatic; TMs normal; nasal mucosa normal; oral mucosa normal Neck: supple with FROM; no carotid bruits Lungs: clear to auscultation bilaterally Heart: regular rate and rhythm Abdomen: soft, non-tender; bowel sounds normal Extremities: no cyanosis or edema; symmetrical with no gross deformities Skin: warm and dry Neurologic: CN 2-12 grossly intact; strength and sensation intact about the bilateral upper and lower extremities; negative pronator drift; finger to nose without difficulty Psychological: alert and cooperative; normal mood and affect  Labs:  Results for orders placed or performed during the hospital encounter of 06/18/19 (from the past 24 hour(s))  POCT urinalysis dipstick     Status: None   Collection Time: 06/18/19 11:54 AM  Result Value Ref Range   Color, UA yellow yellow   Clarity, UA clear clear   Glucose, UA negative negative mg/dL   Bilirubin, UA negative negative   Ketones, POC UA negative negative mg/dL   Spec Grav, UA 5.956 3.875 - 1.025   Blood, UA negative negative   pH, UA 7.0 5.0 - 8.0   Protein Ur, POC negative negative mg/dL   Urobilinogen, UA 0.2 0.2 or 1.0 E.U./dL   Nitrite, UA Negative  Negative   Leukocytes, UA Negative Negative  POCT CBG (manual entry)     Status: Abnormal   Collection Time: 06/18/19 11:54 AM  Result Value Ref Range   POCT Glucose ([facility]) 118 (A) 70 - 99 mg/dL     ASSESSMENT & PLAN:  1. Elevated blood pressure reading   2. Dizzy   3. Lightheaded     Blood pressure 130/74, this is great! Urine did not show signs of infection, dehydration or blood Blood glucose 118.  This is also great! Orthostatic vital signs were within normal limited CBC and BMP drawn.  We will follow up with you regarding  abnormal results Rest and incorporate drinking some water Begin OTC allergy medication and/or Flonase to help with sinus congestion, which may be contributing to your lightheadedness/ dizziness Follow up with Dr. Karilyn Cota as needed Return or go the ED if you have any new or worsening symptoms including high blood pressure, chest pain, shortness of breath, abdominal pain, changes in bowel or bladder habits, passing out/ fainting, weakness in arms or legs, slurred speech, facial droop, etc...   Reviewed expectations re: course of current medical issues. Questions answered. Outlined signs and symptoms indicating need for more acute intervention. Patient verbalized understanding. After Visit Summary given.    Rennis Harding, PA-C 06/18/19 1212    Alvino Chapel [place], PA-C 06/18/19 1410

## 2019-06-19 LAB — CBC WITH DIFFERENTIAL/PLATELET
Basophils Absolute: 0 10*3/uL (ref 0.0–0.2)
Basos: 0 %
EOS (ABSOLUTE): 0.1 10*3/uL (ref 0.0–0.4)
Eos: 3 %
Hematocrit: 43.5 % (ref 37.5–51.0)
Hemoglobin: 14.6 g/dL (ref 13.0–17.7)
Immature Grans (Abs): 0 10*3/uL (ref 0.0–0.1)
Immature Granulocytes: 0 %
Lymphocytes Absolute: 1.8 10*3/uL (ref 0.7–3.1)
Lymphs: 36 %
MCH: 31.3 pg (ref 26.6–33.0)
MCHC: 33.6 g/dL (ref 31.5–35.7)
MCV: 93 fL (ref 79–97)
Monocytes Absolute: 0.6 10*3/uL (ref 0.1–0.9)
Monocytes: 12 %
Neutrophils Absolute: 2.4 10*3/uL (ref 1.4–7.0)
Neutrophils: 49 %
Platelets: 268 10*3/uL (ref 150–450)
RBC: 4.67 x10E6/uL (ref 4.14–5.80)
RDW: 13.9 % (ref 11.6–15.4)
WBC: 5 10*3/uL (ref 3.4–10.8)

## 2019-06-19 LAB — BASIC METABOLIC PANEL
BUN/Creatinine Ratio: 16 (ref 10–24)
BUN: 15 mg/dL (ref 8–27)
CO2: 24 mmol/L (ref 20–29)
Calcium: 9.4 mg/dL (ref 8.6–10.2)
Chloride: 102 mmol/L (ref 96–106)
Creatinine, Ser: 0.91 mg/dL (ref 0.76–1.27)
GFR calc Af Amer: 92 mL/min/{1.73_m2} (ref >59)
GFR calc non Af Amer: 79 mL/min/{1.73_m2} (ref >59)
Glucose: 80 mg/dL (ref 65–99)
Potassium: 5.2 mmol/L (ref 3.5–5.2)
Sodium: 139 mmol/L (ref 134–144)

## 2019-07-01 ENCOUNTER — Ambulatory Visit (INDEPENDENT_AMBULATORY_CARE_PROVIDER_SITE_OTHER): Payer: Medicare HMO

## 2019-07-01 ENCOUNTER — Encounter (INDEPENDENT_AMBULATORY_CARE_PROVIDER_SITE_OTHER): Payer: Self-pay

## 2019-07-01 ENCOUNTER — Telehealth (INDEPENDENT_AMBULATORY_CARE_PROVIDER_SITE_OTHER): Payer: Self-pay

## 2019-07-01 ENCOUNTER — Other Ambulatory Visit: Payer: Self-pay

## 2019-07-01 VITALS — BP 160/70 | Wt 190.2 lb

## 2019-07-01 DIAGNOSIS — I10 Essential (primary) hypertension: Secondary | ICD-10-CM

## 2019-07-01 NOTE — Telephone Encounter (Signed)
Joel Guerra, CMA  

## 2019-07-15 ENCOUNTER — Ambulatory Visit (INDEPENDENT_AMBULATORY_CARE_PROVIDER_SITE_OTHER): Payer: Medicare HMO | Admitting: Internal Medicine

## 2019-07-15 ENCOUNTER — Encounter (INDEPENDENT_AMBULATORY_CARE_PROVIDER_SITE_OTHER): Payer: Self-pay | Admitting: Internal Medicine

## 2019-07-15 ENCOUNTER — Other Ambulatory Visit: Payer: Self-pay

## 2019-07-15 VITALS — BP 145/75 | HR 62 | Temp 98.2°F | Ht 73.5 in | Wt 189.2 lb

## 2019-07-15 DIAGNOSIS — I10 Essential (primary) hypertension: Secondary | ICD-10-CM

## 2019-07-15 MED ORDER — AMLODIPINE BESYLATE 2.5 MG PO TABS: 2.5000 mg | ORAL_TABLET | Freq: Every day | ORAL | 3 refills | Status: DC

## 2019-07-15 NOTE — Progress Notes (Signed)
Metrics: Intervention Frequency ACO  Documented Smoking Status Yearly  Screened one or more times in 24 months  Cessation Counseling or  Active cessation medication Past 24 months  Past 24 months   Guideline developer: UpToDate (See UpToDate for funding source) Date Released: 2014       Wellness Office Visit  Subjective:  Patient ID: Joel Guerra, male    DOB: 1940-11-03  Age: 79 y.o. MRN: [id number]  CC: This man comes in for follow-up regarding his hypertension. HPI  He went to the urgent care about a month ago as his home blood pressure readings were elevated.  When he went to the urgent care, his blood pressure was in the normal range.  He was asked to follow-up with me.  His home readings show elevated blood pressure with symptoms of dizziness/wooziness.  The highest reading I saw was systolic blood pressure in the 180s.  Most blood pressure readings have been fairly normal however.  He is worried about his blood pressure. Past Medical History:  Diagnosis Date  . HLD (hyperlipidemia) 01/20/2019  . Prediabetes 01/20/2019  . Retinal detachment    Right  . Vitamin D deficiency disease 01/20/2019      Family History  Problem Relation Age of Onset  . Cancer Father        laryngal cancer  . Diabetes Sister   . Osteoporosis Brother   . Heart disease Other     Social History   Social History Narrative  . Not on file   Social History   Tobacco Use  . Smoking status: Never Smoker  . Smokeless tobacco: Never Used  Substance Use Topics  . Alcohol use: Yes    Alcohol/week: 2.0 standard drinks    Types: 2 Cans of beer per week    Comment: Every day    Current Meds  Medication Sig  . Ascorbic Acid (VITAMIN C) 500 MG CAPS Take 1 capsule by mouth daily as needed.  . Cholecalciferol (VITAMIN D) 125 MCG (5000 UT) CAPS Take 5,000 Units by mouth daily.   Marland Kitchen loratadine (CLARITIN) 10 MG tablet Take 10 mg by mouth daily as needed for allergies.  Marland Kitchen meclizine (ANTIVERT) 25 MG  tablet Take 1 tablet (25 mg total) by mouth 3 (three) times daily as needed for dizziness.  Vladimir Faster Glycol-Propyl Glycol (SYSTANE) 0.4-0.3 % SOLN Apply 1 drop to eye daily.  . [DISCONTINUED] sildenafil (VIAGRA) 50 MG tablet Take 1 tablet (50 mg total) by mouth daily as needed for erectile dysfunction.     Objective:   Today's Vitals: BP (!) 145/75 (BP Location: Left Arm, Patient Position: Sitting, Cuff Size: Normal)   Pulse 62   Temp 98.2 F (36.8 C) (Temporal)   Ht 6' 1.5" (1.867 m)   Wt 189 lb 3.2 oz (85.8 kg)   SpO2 95%   BMI 24.62 kg/m  Vitals with BMI 07/15/2019 07/01/2019 06/18/2019  Height 6' 1.5" - -  Weight 189 lbs 3 oz 190 lbs 3 oz -  BMI 71.06 - -  Systolic 269 485 462  Diastolic 75 70 74  Pulse 62 - 60     Physical Exam  He looks systemically well.  Systolic blood pressure elevated today.  He feels well.  No focal neurological signs.     Assessment   1. Essential hypertension       Tests ordered No orders of the defined types were placed in this encounter.    Plan: 1. I think that it would be  reasonable to start him on his low-dose of amlodipine 2.5 mg for his hypertension and see how this works. 2. Follow-up in 1 month.   Meds ordered this encounter  Medications  . amLODipine (NORVASC) 2.5 MG tablet    Sig: Take 1 tablet (2.5 mg total) by mouth daily.    Dispense:  30 tablet    Refill:  3    Ragena Fiola Normajean Glasgow, MD

## 2019-08-07 ENCOUNTER — Ambulatory Visit (INDEPENDENT_AMBULATORY_CARE_PROVIDER_SITE_OTHER): Payer: Medicare HMO | Admitting: Internal Medicine

## 2019-08-13 ENCOUNTER — Encounter (INDEPENDENT_AMBULATORY_CARE_PROVIDER_SITE_OTHER): Payer: Self-pay | Admitting: Internal Medicine

## 2019-08-13 ENCOUNTER — Other Ambulatory Visit: Payer: Self-pay

## 2019-08-13 ENCOUNTER — Ambulatory Visit (INDEPENDENT_AMBULATORY_CARE_PROVIDER_SITE_OTHER): Payer: Medicare HMO | Admitting: Internal Medicine

## 2019-08-13 VITALS — BP 130/80 | HR 60 | Temp 97.9°F | Ht 73.5 in | Wt 190.6 lb

## 2019-08-13 DIAGNOSIS — I10 Essential (primary) hypertension: Secondary | ICD-10-CM | POA: Diagnosis not present

## 2019-08-13 NOTE — Progress Notes (Signed)
Metrics: Intervention Frequency ACO  Documented Smoking Status Yearly  Screened one or more times in 24 months  Cessation Counseling or  Active cessation medication Past 24 months  Past 24 months   Guideline developer: UpToDate (See UpToDate for funding source) Date Released: 2014       Wellness Office Visit  Subjective:  Patient ID: Joel Guerra, male    DOB: 09/27/1940  Age: 79 y.o. MRN: [id number]  CC: This man comes in for follow-up of hypertension. HPI  He has tolerated low-dose amlodipine 2.5 mg daily and his home blood pressure readings have improved. Past Medical History:  Diagnosis Date  . HLD (hyperlipidemia) 01/20/2019  . Prediabetes 01/20/2019  . Retinal detachment    Right  . Vitamin D deficiency disease 01/20/2019      Family History  Problem Relation Age of Onset  . Cancer Father        laryngal cancer  . Diabetes Sister   . Osteoporosis Brother   . Heart disease Other     Social History   Social History Narrative  . Not on file   Social History   Tobacco Use  . Smoking status: Never Smoker  . Smokeless tobacco: Never Used  Substance Use Topics  . Alcohol use: Yes    Alcohol/week: 2.0 standard drinks    Types: 2 Cans of beer per week    Comment: Every day    Current Meds  Medication Sig  . amLODipine (NORVASC) 2.5 MG tablet Take 1 tablet (2.5 mg total) by mouth daily.  . Ascorbic Acid (VITAMIN C) 500 MG CAPS Take 1 capsule by mouth daily as needed.  . Cholecalciferol (VITAMIN D) 125 MCG (5000 UT) CAPS Take 5,000 Units by mouth daily.   Bertram Gala Glycol-Propyl Glycol (SYSTANE) 0.4-0.3 % SOLN Apply 1 drop to eye daily.       Objective:   Today's Vitals: BP 130/80 (BP Location: Left Arm, Patient Position: Sitting, Cuff Size: Normal)   Pulse 60   Temp 97.9 F (36.6 C) (Temporal)   Ht 6' 1.5" (1.867 m)   Wt 190 lb 9.6 oz (86.5 kg)   SpO2 94%   BMI 24.81 kg/m  Vitals with BMI 08/13/2019 07/15/2019 07/01/2019  Height 6' 1.5" 6' 1.5" -   Weight 190 lbs 10 oz 189 lbs 3 oz 190 lbs 3 oz  BMI 24.8 24.62 -  Systolic 130 145 989  Diastolic 80 75 70  Pulse 60 62 -     Physical Exam  He looks systemically well. His blood pressure is much improved compared to the last time and  within normal limits.     Assessment   1. Essential hypertension       Tests ordered No orders of the defined types were placed in this encounter.    Plan: 1. He will continue with amlodipine 2.5 mg daily. 2. Follow-up with Maralyn Sago in about 3 months time.   No orders of the defined types were placed in this encounter.   Wilson Singer, MD

## 2019-09-15 ENCOUNTER — Encounter (INDEPENDENT_AMBULATORY_CARE_PROVIDER_SITE_OTHER): Payer: Self-pay | Admitting: Nurse Practitioner

## 2019-09-15 ENCOUNTER — Telehealth (INDEPENDENT_AMBULATORY_CARE_PROVIDER_SITE_OTHER): Payer: Self-pay | Admitting: Internal Medicine

## 2019-09-15 ENCOUNTER — Ambulatory Visit (INDEPENDENT_AMBULATORY_CARE_PROVIDER_SITE_OTHER): Payer: Medicare HMO | Admitting: Nurse Practitioner

## 2019-09-15 ENCOUNTER — Other Ambulatory Visit: Payer: Self-pay

## 2019-09-15 VITALS — BP 160/75 | HR 57 | Temp 98.1°F | Ht 73.0 in | Wt 194.6 lb

## 2019-09-15 DIAGNOSIS — R292 Abnormal reflex: Secondary | ICD-10-CM | POA: Diagnosis not present

## 2019-09-15 DIAGNOSIS — I10 Essential (primary) hypertension: Secondary | ICD-10-CM

## 2019-09-15 DIAGNOSIS — M5441 Lumbago with sciatica, right side: Secondary | ICD-10-CM | POA: Diagnosis not present

## 2019-09-15 DIAGNOSIS — M5442 Lumbago with sciatica, left side: Secondary | ICD-10-CM | POA: Diagnosis not present

## 2019-09-15 LAB — POCT URINALYSIS DIPSTICK
Bilirubin, UA: 0.2
Blood, UA: NEGATIVE
Clarity, UA: NORMAL
Glucose, UA: NEGATIVE
Ketones, UA: NEGATIVE
Leukocytes, UA: NEGATIVE
Nitrite, UA: NEGATIVE
Protein, UA: NEGATIVE
Spec Grav, UA: 1.01 (ref 1.010–1.025)
Urobilinogen, UA: 0.2 E.U./dL
pH, UA: 5 (ref 5.0–8.0)

## 2019-09-15 LAB — COMPLETE METABOLIC PANEL WITH GFR
AG Ratio: 1.6 (calc) (ref 1.0–2.5)
ALT: 7 U/L — ABNORMAL LOW (ref 9–46)
AST: 17 U/L (ref 10–35)
Albumin: 4.2 g/dL (ref 3.6–5.1)
Alkaline phosphatase (APISO): 52 U/L (ref 35–144)
BUN: 14 mg/dL (ref 7–25)
CO2: 28 mmol/L (ref 20–32)
Calcium: 9.7 mg/dL (ref 8.6–10.3)
Chloride: 104 mmol/L (ref 98–110)
Creat: 0.94 mg/dL (ref 0.70–1.11)
GFR, Est African American: 88 mL/min/{1.73_m2} (ref >=60)
GFR, Est Non African American: 76 mL/min/{1.73_m2} (ref >=60)
Globulin: 2.6 g/dL (calc) (ref 1.9–3.7)
Glucose, Bld: 82 mg/dL (ref 65–99)
Potassium: 4.3 mmol/L (ref 3.5–5.3)
Sodium: 140 mmol/L (ref 135–146)
Total Bilirubin: 0.5 mg/dL (ref 0.2–1.2)
Total Protein: 6.8 g/dL (ref 6.1–8.1)

## 2019-09-15 MED ORDER — CYCLOBENZAPRINE HCL 5 MG PO TABS: 5.0000 mg | ORAL_TABLET | Freq: Three times a day (TID) | ORAL | 1 refills | Status: DC | PRN

## 2019-09-15 MED ORDER — PREDNISONE 20 MG PO TABS: 40.0000 mg | ORAL_TABLET | Freq: Every day | ORAL | 0 refills | Status: DC

## 2019-09-15 NOTE — Progress Notes (Signed)
Subjective:  Patient ID: Joel Guerra, male    DOB: October 24, 1940  Age: 79 y.o. MRN: [id number]  CC:  Chief Complaint  Patient presents with  . Back Pain    pain radiates down the leg      HPI  This patient comes in today for a acute visit for the above.  He tells me that proxy 1 week ago he started experiencing low back pain.  He tells me he does have some radiation down bilateral lower extremities.  He tells me the pain is constant but waxes and wanes in intensity.  He rates the pain as a 7-8 out of 10.  He tells me it is generally dull when he is sitting, but becomes sharp when he bends over or lays back up.  He denies any weakness or sensation changes to his lower extremities, and denies any bowel or bladder incontinence.  He has taken Aleve which helps a dull pain.  He does not have any recent history of traumatic event that could explain the pain.  He has had episodes of low back pain in the past.  He is also had a history of kidney stones in the past.  He tells me the pain is not as severe as it was in the past when he had a kidney stone.   Past Medical History:  Diagnosis Date  . HLD (hyperlipidemia) 01/20/2019  . Prediabetes 01/20/2019  . Retinal detachment    Right  . Vitamin D deficiency disease 01/20/2019      Family History  Problem Relation Age of Onset  . Cancer Father        laryngal cancer  . Diabetes Sister   . Osteoporosis Brother   . Heart disease Other     Social History   Social History Narrative  . Not on file   Social History   Tobacco Use  . Smoking status: Never Smoker  . Smokeless tobacco: Never Used  Substance Use Topics  . Alcohol use: Yes    Alcohol/week: 2.0 standard drinks    Types: 2 Cans of beer per week    Comment: Every day     Current Meds  Medication Sig  . amLODipine (NORVASC) 2.5 MG tablet Take 1 tablet (2.5 mg total) by mouth daily.  . Ascorbic Acid (VITAMIN C) 500 MG CAPS Take 1 capsule by mouth daily as needed.  .  Cholecalciferol (VITAMIN D) 125 MCG (5000 UT) CAPS Take 5,000 Units by mouth daily.   Vladimir Faster Glycol-Propyl Glycol (SYSTANE) 0.4-0.3 % SOLN Apply 1 drop to eye daily.    ROS:  Review of Systems  Gastrointestinal:       (-) incontinence  Genitourinary: Positive for urgency. Negative for dysuria, frequency and hematuria.       (-) incontinence  Musculoskeletal: Positive for back pain. Negative for falls.  Neurological: Negative for sensory change and weakness.     Objective:   Today's Vitals: BP (!) 160/75 (BP Location: Left Arm, Patient Position: Sitting, Cuff Size: Normal)   Pulse (!) 57   Temp 98.1 F (36.7 C) (Temporal)   Ht '6\' 1"'  (1.854 m)   Wt 194 lb 9.6 oz (88.3 kg)   SpO2 95%   BMI 25.67 kg/m  Vitals with BMI 09/15/2019 09/15/2019 08/13/2019  Height - '6\' 1"'  6' 1.5"  Weight - 194 lbs 10 oz 190 lbs 10 oz  BMI - 36.62 94.7  Systolic - 654 650  Diastolic - 75 80  Pulse 57 115 60     Physical Exam Musculoskeletal:     Lumbar back: Normal. No swelling, edema, deformity, signs of trauma, spasms, tenderness or bony tenderness.  Neurological:     Mental Status: He is alert and oriented to person, place, and time.     Sensory: Sensation is intact.     Motor: Motor function is intact.     Coordination: Coordination is intact.     Gait: Gait is intact.     Deep Tendon Reflexes:     Reflex Scores:      Patellar reflexes are 1+ on the right side and 0 on the left side.         Assessment and Plan   1. Acute bilateral low back pain with bilateral sciatica   2. Essential hypertension      Plan: 1.  Absent patellar reflex noted on left leg, urine was checked via dipstick today and was clear.  He is quite concerned about renal function, thus I will also collect metabolic panel for further evaluation of his kidneys. I will treat him with a course of prednisone, as needed muscle relaxer, and recommended that he take Tylenol 1000 mg every 8 hours as needed for pain.   I did discuss side effects of prednisone with him today.  I also recommend that he avoid Aleve or other NSAIDs until after he has finished his prednisone.  I did discuss that he should avoid driving or operating any heavy machinery while taking his muscle relaxer.  He tells me he understands.  If his symptoms do not improve or if they worsen, or if he experiences any red flag symptoms such as sensation changes, weakness, or bowel/bladder incontinence he will let me know.  Because of the absent reflex I will also try to get a CT scan of his lumbar spine.  He tells me he does go on a vacation at the end of this week, he will be gone for approximately 7 days.  I told him that the CT scan can probably be done when he comes back from vacation, unless he experiences any of the red flag symptoms we discussed as written above.  I told him if any of these occur that he needs to get to an emergency department if he is at a time.  He tells me he understands.  2.  Blood pressure is quite elevated today.  He has been checking at home and brings logs with him today.  Per review I see that his systolic blood pressure runs between 120s to 322G and diastolic blood pressures in the 70s.  I think his blood pressure may be elevated right now due to the pain that he is in.  I will not make changes to his medications for his hypertension, but will try to get his pain controlled.  Tests ordered Orders Placed This Encounter  Procedures  . CMP with eGFR([facility])  . POC Urinalysis Dipstick      Meds ordered this encounter  Medications  . cyclobenzaprine (FLEXERIL) 5 MG tablet    Sig: Take 1 tablet (5 mg total) by mouth 3 (three) times daily as needed for muscle spasms.    Dispense:  30 tablet    Refill:  1    Order Specific Question:   Supervising Provider    Answer:   Hurshel Party C [[id number]]  . predniSONE (DELTASONE) 20 MG tablet    Sig: Take 2 tablets (40 mg total) by mouth daily with  breakfast.    Dispense:  10  tablet    Refill:  0    Order Specific Question:   Supervising Provider    Answer:   Doree Albee [[id number]]    Patient to follow-up as scheduled or sooner as needed.   Ailene Ards, NP

## 2019-09-15 NOTE — Telephone Encounter (Signed)
He will need to be seen in person either by me or Sarah.  Thanks.

## 2019-09-16 ENCOUNTER — Telehealth (INDEPENDENT_AMBULATORY_CARE_PROVIDER_SITE_OTHER): Payer: Self-pay

## 2019-09-16 NOTE — Telephone Encounter (Signed)
Mr. Raden is calling asking if we can cancel the CT scan of his back, he states he thinks he has a pulled muscle and is feeling better please advise?

## 2019-09-16 NOTE — Telephone Encounter (Signed)
Dr. Karilyn Cota, please advise.  This was the patient who had an absent patellar reflex I believe to his left extremity on exam the other day.  I prescribed him prednisone and muscle relaxer for back pain, it appears that his pain is better.  Do think he should still go through with the CT scan considering the absence of his reflex, would you think that he can cancel the CT scan based on the fact that his pain has improved?

## 2019-09-16 NOTE — Telephone Encounter (Signed)
If his symptoms are better and he does not want to do the CT scan, this is his choice.  Since you  found an abnormality in his reflexes, it is still medically indicated.  Therefore, if the patient refuses the CT scan , please document this in the chart.

## 2019-09-17 NOTE — Telephone Encounter (Signed)
Joel Guerra is aware

## 2019-09-17 NOTE — Telephone Encounter (Signed)
Joel Guerra, I was actually just notified that the insurance company is not been approve coverage for the CT scan.  I did call his insurance company and did discuss that he had an absent reflex on the left side, they are aware of this and still do not feel that CT scan is warranted at this time.  Thus, he does not have to go get the CT scan as it will not be covered by his insurance company.  Please let him know that if his symptoms worsen especially if he experiences worsening weakness, worsening sensation changes, worsening pain, incontinence of bowel or bladder that he needs to let us know right away.  If the symptoms occur and we are not open or unavailable he needs to call 911.  Thank you.

## 2019-09-17 NOTE — Telephone Encounter (Signed)
Joel Guerra, please call patient let him know that the fact that he had an absent reflex means that the CT scan is recommended.  Of course it is always his choice whether or not he wants to go through with the CT scan, but my recommendation would still be to have the CT scan completed.

## 2019-09-17 NOTE — Telephone Encounter (Signed)
Mr. Joel Guerra will do the CT but will reschedule it at a different day & time

## 2019-09-24 ENCOUNTER — Ambulatory Visit (HOSPITAL_COMMUNITY): Payer: Medicare HMO

## 2019-10-10 ENCOUNTER — Ambulatory Visit (HOSPITAL_COMMUNITY): Payer: Medicare HMO

## 2019-11-12 ENCOUNTER — Other Ambulatory Visit: Payer: Self-pay

## 2019-11-12 ENCOUNTER — Encounter (INDEPENDENT_AMBULATORY_CARE_PROVIDER_SITE_OTHER): Payer: Self-pay | Admitting: Nurse Practitioner

## 2019-11-12 ENCOUNTER — Ambulatory Visit (INDEPENDENT_AMBULATORY_CARE_PROVIDER_SITE_OTHER): Payer: Medicare HMO | Admitting: Nurse Practitioner

## 2019-11-12 VITALS — BP 140/70 | HR 60 | Temp 97.7°F | Ht 73.0 in | Wt 196.6 lb

## 2019-11-12 DIAGNOSIS — I10 Essential (primary) hypertension: Secondary | ICD-10-CM

## 2019-11-12 DIAGNOSIS — E559 Vitamin D deficiency, unspecified: Secondary | ICD-10-CM

## 2019-11-12 DIAGNOSIS — R7303 Prediabetes: Secondary | ICD-10-CM

## 2019-11-12 DIAGNOSIS — E782 Mixed hyperlipidemia: Secondary | ICD-10-CM | POA: Diagnosis not present

## 2019-11-12 DIAGNOSIS — R6882 Decreased libido: Secondary | ICD-10-CM

## 2019-11-12 MED ORDER — AMLODIPINE BESYLATE 2.5 MG PO TABS: 2.5000 mg | ORAL_TABLET | Freq: Every day | ORAL | 0 refills | Status: DC

## 2019-11-12 NOTE — Progress Notes (Signed)
Subjective:  Patient ID: Joel Guerra, male    DOB: 03-28-1941  Age: 79 y.o. MRN: [id number]  CC:  Chief Complaint  Patient presents with  . vitamin d deficiency  . Hyperlipidemia  . Hypertension  . Other    Prediabetes, low sexual desire      HPI  This patient arrives today for the above.  Vitamin D deficiency: He is told to take a vitamin D3 supplement daily, however he often has a difficult time remembering to take this.  He tells me over the last month he has maybe taken 3 or 4 doses.  Last serum level was collected in January of this year and it was below 30.  Hyperlipidemia: He is not currently on any medication to treat his hyperlipidemia.  Last LDL level was 102.  He tells me in the past he was on a high dose of statin and did not want to be on this medication.  At one point he was taken off of it and will prefer to stay off of this if possible.  He does try to follow a healthy lifestyle.  Hypertension: He has a history of hypertension and is currently on amlodipine 2.5 mg daily.  He is tolerating his medication well.  He tells me today in the office his blood pressure is a bit higher than it is at home.  He tells me at home his systolic blood pressure usually runs between 120 and 130.  Prediabetes: He does have a history of prediabetes.  Last A1c was collected in September 2020 and it was 5.5.  Low sexual desire: He also mentions to me today that he has noticed his sexual desire reducing over time.  This is troublesome and bothersome to him.  He does not believe he has ever been tested for hypogonadism.  He does not feel that he is especially depressed or stressed.  He does report some emotional stress related to a family member of his.  Past Medical History:  Diagnosis Date  . HLD (hyperlipidemia) 01/20/2019  . Prediabetes 01/20/2019  . Retinal detachment    Right  . Vitamin D deficiency disease 01/20/2019      Family History  Problem Relation Age of Onset  .  Cancer Father        laryngal cancer  . Diabetes Sister   . Osteoporosis Brother   . Heart disease Other     Social History   Social History Narrative  . Not on file   Social History   Tobacco Use  . Smoking status: Never Smoker  . Smokeless tobacco: Never Used  Substance Use Topics  . Alcohol use: Yes    Alcohol/week: 2.0 standard drinks    Types: 2 Cans of beer per week    Comment: Every day     Current Meds  Medication Sig  . amLODipine (NORVASC) 2.5 MG tablet Take 1 tablet (2.5 mg total) by mouth daily.  . Ascorbic Acid (VITAMIN C) 500 MG CAPS Take 1 capsule by mouth daily as needed.  . Cholecalciferol (VITAMIN D) 125 MCG (5000 UT) CAPS Take 5,000 Units by mouth daily.   Bertram Gala Glycol-Propyl Glycol (SYSTANE) 0.4-0.3 % SOLN Apply 1 drop to eye daily.  . [DISCONTINUED] amLODipine (NORVASC) 2.5 MG tablet Take 1 tablet (2.5 mg total) by mouth daily.    ROS:  Review of Systems  Constitutional: Negative for fever.  Eyes: Negative for blurred vision.  Respiratory: Negative for shortness of breath.  Cardiovascular: Negative for chest pain and palpitations.  Neurological: Negative for headaches.     Objective:   Today's Vitals: BP 140/70 (BP Location: Left Arm, Patient Position: Sitting, Cuff Size: Normal)   Pulse 60   Temp 97.7 F (36.5 C) (Temporal)   Ht 6\' 1"  (1.854 m)   Wt 196 lb 9.6 oz (89.2 kg)   SpO2 95%   BMI 25.94 kg/m  Vitals with BMI 11/12/2019 09/15/2019 09/15/2019  Height 6\' 1"  - 6\' 1"   Weight 196 lbs 10 oz - 194 lbs 10 oz  BMI 25.94 - 25.68  Systolic 140 - 160  Diastolic 70 - 75  Pulse 60 57 09/17/2019     Physical Exam Vitals reviewed.  Constitutional:      Appearance: Normal appearance.  HENT:     Head: Normocephalic and atraumatic.  Cardiovascular:     Rate and Rhythm: Normal rate and regular rhythm.     Heart sounds: Murmur heard.   Pulmonary:     Effort: Pulmonary effort is normal.     Breath sounds: Normal breath sounds.   Musculoskeletal:     Cervical back: Neck supple.  Skin:    General: Skin is warm and dry.  Neurological:     Mental Status: He is alert and oriented to person, place, and time.  Psychiatric:        Mood and Affect: Mood normal.        Behavior: Behavior normal.        Thought Content: Thought content normal.        Judgment: Judgment normal.       PHQ9 SCORE ONLY 11/12/2019 09/15/2019 07/15/2019  PHQ-9 Total Score 4 0 0      Assessment and Plan   1. Essential hypertension   2. Vitamin D deficiency disease   3. Prediabetes   4. Mixed hyperlipidemia   5. Decreased sex drive      Plan: 1.  He will continue on his amlodipine as currently prescribed.  Refill sent today to his pharmacy.  2.  I encouraged him to start taking his vitamin D3 supplement on a daily basis if possible.  He tells me he will try.  3.  He will continue to try to adhere to healthy lifestyle.  Will consider collecting A1c and lipid panel next office visit.  5.  PHQ 9 score is quite low today which indicates if depression is occurring is minimal.  Because it is midmorning I recommended that he follow-up with Dr. 11/14/2019 for an early morning visit at which point can consider getting testosterone levels in addition to the other blood work.  He tells me he understands would like to do this.   Tests ordered No orders of the defined types were placed in this encounter.     Meds ordered this encounter  Medications  . amLODipine (NORVASC) 2.5 MG tablet    Sig: Take 1 tablet (2.5 mg total) by mouth daily.    Dispense:  90 tablet    Refill:  0    Order Specific Question:   Supervising Provider    Answer:   09/17/2019 [[id number]]    Patient to follow-up in 6 weeks with Dr. 07/17/2019 for early morning blood work including testosterone levels for further evaluation of his decreased sex drive.  Karilyn Cota, NP

## 2019-12-30 ENCOUNTER — Ambulatory Visit (INDEPENDENT_AMBULATORY_CARE_PROVIDER_SITE_OTHER): Payer: Medicare HMO | Admitting: Internal Medicine

## 2019-12-30 ENCOUNTER — Encounter (INDEPENDENT_AMBULATORY_CARE_PROVIDER_SITE_OTHER): Payer: Self-pay | Admitting: Internal Medicine

## 2019-12-30 ENCOUNTER — Other Ambulatory Visit: Payer: Self-pay

## 2019-12-30 VITALS — BP 140/85 | HR 60 | Temp 97.5°F | Ht 73.0 in | Wt 197.4 lb

## 2019-12-30 DIAGNOSIS — E559 Vitamin D deficiency, unspecified: Secondary | ICD-10-CM

## 2019-12-30 DIAGNOSIS — R7303 Prediabetes: Secondary | ICD-10-CM

## 2019-12-30 DIAGNOSIS — I10 Essential (primary) hypertension: Secondary | ICD-10-CM

## 2019-12-30 NOTE — Progress Notes (Signed)
Metrics: Intervention Frequency ACO  Documented Smoking Status Yearly  Screened one or more times in 24 months  Cessation Counseling or  Active cessation medication Past 24 months  Past 24 months   Guideline developer: UpToDate (See UpToDate for funding source) Date Released: 2014       Wellness Office Visit  Subjective:  Patient ID: Joel Guerra, male    DOB: 08-19-1940  Age: 79 y.o. MRN: [id number]  CC: This man comes in for follow-up of hypertension, vitamin D deficiency and prediabetes. HPI  He is doing reasonably well.  He says he cannot remember to take vitamin D3 supplementation on a daily basis all the time and he has not taken it in several days.  His vitamin D levels were very low previously. He continues with amlodipine at a low dose for his hypertension. The last time we checked in for prediabetes which was almost a year ago showed a hemoglobin A1c of 5.5% in a good range. He denies any chest pain, dyspnea, palpitations or limb weakness. Past Medical History:  Diagnosis Date  . HLD (hyperlipidemia) 01/20/2019  . Murmur   . Prediabetes 01/20/2019  . Retinal detachment    Right  . Vitamin D deficiency disease 01/20/2019   Past Surgical History:  Procedure Laterality Date  . ANKLE ARTHROPLASTY  1970  . cataract Bilateral   . FOOT SURGERY    . LACRIMAL GLAND BIOPSY    . other  2008   Parotid gland stone removal   . RETINAL DETACHMENT SURGERY Right      Family History  Problem Relation Age of Onset  . Cancer Father        laryngal cancer  . Diabetes Sister   . Osteoporosis Brother   . Heart disease Other     Social History   Social History Narrative  . Not on file   Social History   Tobacco Use  . Smoking status: Never Smoker  . Smokeless tobacco: Never Used  Substance Use Topics  . Alcohol use: Yes    Alcohol/week: 2.0 standard drinks    Types: 2 Cans of beer per week    Comment: Every day    Current Meds  Medication Sig  . amLODipine  (NORVASC) 2.5 MG tablet Take 1 tablet (2.5 mg total) by mouth daily.  . Ascorbic Acid (VITAMIN C) 500 MG CAPS Take 1 capsule by mouth daily as needed.  . Cholecalciferol (VITAMIN D) 125 MCG (5000 UT) CAPS Take 5,000 Units by mouth daily.   Bertram Gala Glycol-Propyl Glycol (SYSTANE) 0.4-0.3 % SOLN Apply 1 drop to eye daily.      Depression screen [facility] 2/9 11/12/2019 09/15/2019 07/15/2019 05/30/2019 05/01/2019  Decreased Interest 3 0 0 0 0  Down, Depressed, Hopeless 0 0 0 0 1  PHQ - 2 Score 3 0 0 0 1  Altered sleeping 0 - - - 0  Tired, decreased energy 1 - - - 0  Change in appetite 0 - - - 0  Feeling bad or failure about yourself  0 - - - 0  Trouble concentrating 0 - - - 1  Moving slowly or fidgety/restless 0 - - - 0  Suicidal thoughts 0 - - - 0  PHQ-9 Score 4 - - - 2     Objective:   Today's Vitals: BP 140/85 (BP Location: Left Arm, Patient Position: Sitting, Cuff Size: Normal)   Pulse 60   Temp (!) 97.5 F (36.4 C) (Temporal)   Ht 6\' 1"  (1.854 m)  Wt 197 lb 6.4 oz (89.5 kg)   SpO2 95%   BMI 26.04 kg/m  Vitals with BMI 12/30/2019 11/12/2019 09/15/2019  Height 6\' 1"  6\' 1"  -  Weight 197 lbs 6 oz 196 lbs 10 oz -  BMI 26.05 25.94 -  Systolic 140 140 -  Diastolic 85 70 -  Pulse 60 60 57     Physical Exam   He looks systemically well.  Weight is stable.  Blood pressure is acceptable for his age.  He is alert and orientated without any focal neurological signs.    Assessment   1. Essential hypertension   2. Vitamin D deficiency disease   3. Prediabetes       Tests ordered Orders Placed This Encounter  Procedures  . COMPLETE METABOLIC PANEL WITH GFR  . Hemoglobin A1c     Plan: 1. He will continue with amlodipine 2.5 mg daily for his hypertension. 2. I have urged him to take vitamin D3 every single day and he can take it with his amlodipine in the morning. 3. Blood work is ordered. 4. Follow-up in about 4 months.   No orders of the defined types were placed in  this encounter.   , MD

## 2019-12-31 LAB — HEMOGLOBIN A1C
Hgb A1c MFr Bld: 5.7 % of total Hgb — ABNORMAL HIGH (ref <5.7)
Mean Plasma Glucose: 117 (calc)
eAG (mmol/L): 6.5 (calc)

## 2019-12-31 LAB — COMPLETE METABOLIC PANEL WITH GFR
AG Ratio: 1.6 (calc) (ref 1.0–2.5)
ALT: 8 U/L — ABNORMAL LOW (ref 9–46)
AST: 17 U/L (ref 10–35)
Albumin: 4.1 g/dL (ref 3.6–5.1)
Alkaline phosphatase (APISO): 52 U/L (ref 35–144)
BUN: 13 mg/dL (ref 7–25)
CO2: 29 mmol/L (ref 20–32)
Calcium: 9.3 mg/dL (ref 8.6–10.3)
Chloride: 105 mmol/L (ref 98–110)
Creat: 1.01 mg/dL (ref 0.70–1.11)
GFR, Est African American: 80 mL/min/{1.73_m2} (ref >=60)
GFR, Est Non African American: 69 mL/min/{1.73_m2} (ref >=60)
Globulin: 2.5 g/dL (calc) (ref 1.9–3.7)
Glucose, Bld: 102 mg/dL — ABNORMAL HIGH (ref 65–99)
Potassium: 4.7 mmol/L (ref 3.5–5.3)
Sodium: 140 mmol/L (ref 135–146)
Total Bilirubin: 0.6 mg/dL (ref 0.2–1.2)
Total Protein: 6.6 g/dL (ref 6.1–8.1)

## 2020-01-31 ENCOUNTER — Other Ambulatory Visit (INDEPENDENT_AMBULATORY_CARE_PROVIDER_SITE_OTHER): Payer: Self-pay | Admitting: Nurse Practitioner

## 2020-01-31 DIAGNOSIS — I10 Essential (primary) hypertension: Secondary | ICD-10-CM

## 2020-02-03 ENCOUNTER — Ambulatory Visit (INDEPENDENT_AMBULATORY_CARE_PROVIDER_SITE_OTHER): Payer: Medicare HMO

## 2020-02-03 ENCOUNTER — Other Ambulatory Visit: Payer: Self-pay

## 2020-02-03 DIAGNOSIS — Z23 Encounter for immunization: Secondary | ICD-10-CM

## 2020-02-03 NOTE — Progress Notes (Signed)
Patient was given High Dose Flu Vaccine in Right Deltoid.  Patient tolerated the injection well.

## 2020-03-22 ENCOUNTER — Telehealth (INDEPENDENT_AMBULATORY_CARE_PROVIDER_SITE_OTHER): Payer: Medicare HMO | Admitting: Internal Medicine

## 2020-03-22 ENCOUNTER — Encounter (INDEPENDENT_AMBULATORY_CARE_PROVIDER_SITE_OTHER): Payer: Self-pay | Admitting: Internal Medicine

## 2020-03-22 DIAGNOSIS — J209 Acute bronchitis, unspecified: Secondary | ICD-10-CM

## 2020-03-22 MED ORDER — PREDNISONE 20 MG PO TABS: 40.0000 mg | ORAL_TABLET | Freq: Every day | ORAL | 1 refills | Status: DC

## 2020-03-22 MED ORDER — AZITHROMYCIN 250 MG PO TABS: ORAL_TABLET | ORAL | 0 refills | Status: DC

## 2020-03-22 NOTE — Progress Notes (Signed)
Metrics: Intervention Frequency ACO  Documented Smoking Status Yearly  Screened one or more times in 24 months  Cessation Counseling or  Active cessation medication Past 24 months  Past 24 months   Guideline developer: UpToDate (See UpToDate for funding source) Date Released: 2014       Wellness Office Visit  Subjective:  Patient ID: Joel Guerra, male    DOB: [birth year]  Age: 79 y.o. MRN: [id number]  CC: This is an audio telemedicine visit with the permission of the patient who is at home and I am in my office.  I used 2 identifiers to identify the patient. Cough. HPI  This patient has had a nonproductive cough for the last 2 weeks associated with some wheezing.  He denies fever or body aches.  He denies any significant dyspnea on exertion or even at rest. He denies any loss of taste or smell.  He has not been in contact, to his knowledge, with anyone with COVID-19 disease. Past Medical History:  Diagnosis Date  . HLD (hyperlipidemia) 01/20/2019  . Murmur   . Prediabetes 01/20/2019  . Retinal detachment    Right  . Vitamin D deficiency disease 01/20/2019   Past Surgical History:  Procedure Laterality Date  . ANKLE ARTHROPLASTY  1970  . cataract Bilateral   . FOOT SURGERY    . LACRIMAL GLAND BIOPSY    . other  2008   Parotid gland stone removal   . RETINAL DETACHMENT SURGERY Right      Family History  Problem Relation Age of Onset  . Cancer Father        laryngal cancer  . Diabetes Sister   . Osteoporosis Brother   . Heart disease Other     Social History   Social History Narrative  . Not on file   Social History   Tobacco Use  . Smoking status: Never Smoker  . Smokeless tobacco: Never Used  Substance Use Topics  . Alcohol use: Yes    Alcohol/week: 2.0 standard drinks    Types: 2 Cans of beer per week    Comment: Every day    Current Meds  Medication Sig  . amLODipine (NORVASC) 2.5 MG tablet TAKE 1 TABLET(2.5 MG) BY MOUTH DAILY  . Ascorbic Acid  (VITAMIN C) 500 MG CAPS Take 1 capsule by mouth daily as needed.  . Cholecalciferol (VITAMIN D) 125 MCG (5000 UT) CAPS Take 5,000 Units by mouth daily.   Bertram Gala Glycol-Propyl Glycol (SYSTANE) 0.4-0.3 % SOLN Apply 1 drop to eye daily.      Depression screen [facility] 2/9 11/12/2019 09/15/2019 07/15/2019 05/30/2019 05/01/2019  Decreased Interest 3 0 0 0 0  Down, Depressed, Hopeless 0 0 0 0 1  PHQ - 2 Score 3 0 0 0 1  Altered sleeping 0 - - - 0  Tired, decreased energy 1 - - - 0  Change in appetite 0 - - - 0  Feeling bad or failure about yourself  0 - - - 0  Trouble concentrating 0 - - - 1  Moving slowly or fidgety/restless 0 - - - 0  Suicidal thoughts 0 - - - 0  PHQ-9 Score 4 - - - 2     Objective:   Today's Vitals: There were no vitals taken for this visit. Vitals with BMI 03/22/2020 12/30/2019 11/12/2019  Height (No Data) 6\' 1"  6\' 1"   Weight (No Data) 197 lbs 6 oz 196 lbs 10 oz  BMI - 26.05 25.94  Systolic (No Data)  140 140  Diastolic (No Data) 85 70  Pulse - 60 60     Physical Exam    His speech appears normal on the phone and he does not appear to be  dyspneic   Assessment   1. Acute bronchitis, unspecified organism       Tests ordered No orders of the defined types were placed in this encounter.    Plan: 1. He will try Robitussin-DM over-the-counter and I have sent a prescription for Zithromax and prednisone for what I think is his acute bronchitis.  However, I have told the patient that if these measures do not help him, he must get a COVID-19 test to make sure he does not have COVID-19 disease. 2. This phone call lasted 5 minutes and 11 seconds.   Meds ordered this encounter  Medications  . predniSONE (DELTASONE) 20 MG tablet    Sig: Take 2 tablets (40 mg total) by mouth daily with breakfast.    Dispense:  10 tablet    Refill:  1  . azithromycin (ZITHROMAX) 250 MG tablet    Sig: Take 2 tablets the first day and then 1 tablet every day for the next 4 days     Dispense:  6 tablet    Refill:  0    Kellye Mizner Normajean Glasgow, MD

## 2020-04-29 ENCOUNTER — Other Ambulatory Visit (INDEPENDENT_AMBULATORY_CARE_PROVIDER_SITE_OTHER): Payer: Self-pay | Admitting: Internal Medicine

## 2020-04-29 DIAGNOSIS — I10 Essential (primary) hypertension: Secondary | ICD-10-CM

## 2020-05-03 ENCOUNTER — Other Ambulatory Visit: Payer: Self-pay

## 2020-05-03 ENCOUNTER — Encounter (INDEPENDENT_AMBULATORY_CARE_PROVIDER_SITE_OTHER): Payer: Self-pay | Admitting: Internal Medicine

## 2020-05-03 ENCOUNTER — Ambulatory Visit (INDEPENDENT_AMBULATORY_CARE_PROVIDER_SITE_OTHER): Payer: Medicare HMO | Admitting: Internal Medicine

## 2020-05-03 VITALS — BP 134/80 | HR 68 | Temp 97.5°F | Ht 73.0 in | Wt 196.8 lb

## 2020-05-03 DIAGNOSIS — R7303 Prediabetes: Secondary | ICD-10-CM | POA: Diagnosis not present

## 2020-05-03 DIAGNOSIS — I10 Essential (primary) hypertension: Secondary | ICD-10-CM | POA: Diagnosis not present

## 2020-05-03 DIAGNOSIS — N401 Enlarged prostate with lower urinary tract symptoms: Secondary | ICD-10-CM | POA: Diagnosis not present

## 2020-05-03 DIAGNOSIS — R6882 Decreased libido: Secondary | ICD-10-CM | POA: Diagnosis not present

## 2020-05-03 DIAGNOSIS — R3914 Feeling of incomplete bladder emptying: Secondary | ICD-10-CM

## 2020-05-03 NOTE — Progress Notes (Signed)
Metrics: Intervention Frequency ACO  Documented Smoking Status Yearly  Screened one or more times in 24 months  Cessation Counseling or  Active cessation medication Past 24 months  Past 24 months   Guideline developer: UpToDate (See UpToDate for funding source) Date Released: 2014       Wellness Office Visit  Subjective:  Patient ID: Joel Guerra, male    DOB: 11-30-1940  Age: 80 y.o. MRN: [id number]  CC: This man comes in for follow-up of hypertension, prediabetes. HPI  He also wishes to describe low sex drive which he has had for the last year or so.  In fact, over the last 5 years he has noticed that is gradually gone down.  He wonders if something can be done about it.  He also describes symptoms which may well be BPH.  His last PSA done in December 2020 was in the normal range. He continues on amlodipine for hypertension.  He denies any chest pain, dyspnea, palpitations or limb weakness Past Medical History:  Diagnosis Date  . HLD (hyperlipidemia) 01/20/2019  . Murmur   . Prediabetes 01/20/2019  . Retinal detachment    Right  . Vitamin D deficiency disease 01/20/2019   Past Surgical History:  Procedure Laterality Date  . ANKLE ARTHROPLASTY  1970  . cataract Bilateral   . FOOT SURGERY    . LACRIMAL GLAND BIOPSY    . other  2008   Parotid gland stone removal   . RETINAL DETACHMENT SURGERY Right      Family History  Problem Relation Age of Onset  . Cancer Father        laryngal cancer  . Diabetes Sister   . Osteoporosis Brother   . Heart disease Other     Social History   Social History Narrative  . Not on file   Social History   Tobacco Use  . Smoking status: Never Smoker  . Smokeless tobacco: Never Used  Substance Use Topics  . Alcohol use: Yes    Alcohol/week: 2.0 standard drinks    Types: 2 Cans of beer per week    Comment: Every day    Current Meds  Medication Sig  . amLODipine (NORVASC) 2.5 MG tablet TAKE 1 TABLET(2.5 MG) BY MOUTH DAILY  .  Ascorbic Acid (VITAMIN C) 500 MG CAPS Take 1 capsule by mouth daily as needed.  . Cholecalciferol (VITAMIN D) 125 MCG (5000 UT) CAPS Take 5,000 Units by mouth daily.   Bertram Gala Glycol-Propyl Glycol (SYSTANE) 0.4-0.3 % SOLN Apply 1 drop to eye daily.  . RESTASIS 0.05 % ophthalmic emulsion as needed.      Depression screen [facility] 2/9 05/03/2020 11/12/2019 09/15/2019 07/15/2019 05/30/2019  Decreased Interest 0 3 0 0 0  Down, Depressed, Hopeless 1 0 0 0 0  PHQ - 2 Score 1 3 0 0 0  Altered sleeping 1 0 - - -  Tired, decreased energy 1 1 - - -  Change in appetite 0 0 - - -  Feeling bad or failure about yourself  0 0 - - -  Trouble concentrating 0 0 - - -  Moving slowly or fidgety/restless 0 0 - - -  Suicidal thoughts 0 0 - - -  PHQ-9 Score 3 4 - - -  Difficult doing work/chores Not difficult at all - - - -     Objective:   Today's Vitals: BP 134/80   Pulse 68   Temp (!) 97.5 F (36.4 C) (Temporal)   Ht 6'  1" (1.854 m)   Wt 196 lb 12.8 oz (89.3 kg)   SpO2 96%   BMI 25.96 kg/m  Vitals with BMI 05/03/2020 03/22/2020 12/30/2019  Height 6\' 1"  (No Data) 6\' 1"   Weight 196 lbs 13 oz (No Data) 197 lbs 6 oz  BMI 25.97 - 26.05  Systolic 134 (No Data) 140  Diastolic 80 (No Data) 85  Pulse 68 - 60     Physical Exam  Looks systemically well.  Blood pressure is acceptable for his age.  Alert and orientated without any focal logical signs.     Assessment   1. Essential hypertension   2. Prediabetes   3. Decreased sex drive   4. Benign prostatic hyperplasia with incomplete bladder emptying       Tests ordered Orders Placed This Encounter  Procedures  . COMPLETE METABOLIC PANEL WITH GFR  . Hemoglobin A1c  . PSA, Total with Reflex to PSA, Free  . Testosterone Total,Free,Bio, Males     Plan: 1. He will continue with amlodipine.  We will check electrolytes. 2. I will check an A1c to see the state of his prediabetes, hopefully it is not getting worse. 3. I discussed testosterone  therapy in general and he would like to check his levels to see if he may benefit from therapy.  I briefly discussed the rationale for testosterone therapy and some side effects and benefits. 4. I will see him in the next 2 to 3 weeks time to discuss all the results especially his testosterone.  I spent 30 minutes with this patient answering all his questions regarding testosterone levels and testosterone therapy.   No orders of the defined types were placed in this encounter.   , MD

## 2020-05-04 LAB — COMPLETE METABOLIC PANEL WITH GFR
AG Ratio: 1.5 (calc) (ref 1.0–2.5)
ALT: 9 U/L (ref 9–46)
AST: 16 U/L (ref 10–35)
Albumin: 4 g/dL (ref 3.6–5.1)
Alkaline phosphatase (APISO): 52 U/L (ref 35–144)
BUN: 17 mg/dL (ref 7–25)
CO2: 28 mmol/L (ref 20–32)
Calcium: 9.4 mg/dL (ref 8.6–10.3)
Chloride: 101 mmol/L (ref 98–110)
Creat: 1.07 mg/dL (ref 0.70–1.11)
GFR, Est African American: 75 mL/min/{1.73_m2} (ref >=60)
GFR, Est Non African American: 65 mL/min/{1.73_m2} (ref >=60)
Globulin: 2.7 g/dL (calc) (ref 1.9–3.7)
Glucose, Bld: 101 mg/dL — ABNORMAL HIGH (ref 65–99)
Potassium: 5.1 mmol/L (ref 3.5–5.3)
Sodium: 136 mmol/L (ref 135–146)
Total Bilirubin: 0.5 mg/dL (ref 0.2–1.2)
Total Protein: 6.7 g/dL (ref 6.1–8.1)

## 2020-05-04 LAB — TESTOSTERONE TOTAL,FREE,BIO, MALES
Albumin: 4 g/dL (ref 3.6–5.1)
Sex Hormone Binding: 51 nmol/L (ref 22–77)
Testosterone, Bioavailable: 72.3 ng/dL (ref 15.0–150.0)
Testosterone, Free: 39.3 pg/mL (ref 6.0–73.0)
Testosterone: 425 ng/dL (ref 250–827)

## 2020-05-04 LAB — HEMOGLOBIN A1C
Hgb A1c MFr Bld: 6 % of total Hgb — ABNORMAL HIGH (ref <5.7)
Mean Plasma Glucose: 126 mg/dL
eAG (mmol/L): 7 mmol/L

## 2020-05-04 LAB — PSA, TOTAL WITH REFLEX TO PSA, FREE: PSA, Total: 3.8 ng/mL (ref <=4.0)

## 2020-05-26 ENCOUNTER — Ambulatory Visit (INDEPENDENT_AMBULATORY_CARE_PROVIDER_SITE_OTHER): Payer: Medicare HMO | Admitting: Internal Medicine

## 2020-05-26 ENCOUNTER — Encounter (INDEPENDENT_AMBULATORY_CARE_PROVIDER_SITE_OTHER): Payer: Self-pay | Admitting: Internal Medicine

## 2020-05-26 ENCOUNTER — Other Ambulatory Visit: Payer: Self-pay

## 2020-05-26 VITALS — BP 145/80 | HR 89 | Temp 97.6°F | Resp 19 | Ht 73.0 in | Wt 197.0 lb

## 2020-05-26 DIAGNOSIS — R6882 Decreased libido: Secondary | ICD-10-CM

## 2020-05-26 MED ORDER — TESTOSTERONE 20 % CREA: 100.0000 mg | TOPICAL_CREAM | Freq: Every day | 0 refills | Status: DC

## 2020-05-26 NOTE — Progress Notes (Signed)
Metrics: Intervention Frequency ACO  Documented Smoking Status Yearly  Screened one or more times in 24 months  Cessation Counseling or  Active cessation medication Past 24 months  Past 24 months   Guideline developer: UpToDate (See UpToDate for funding source) Date Released: 2014       Wellness Office Visit  Subjective:  Patient ID: Joel Guerra, male    DOB: 10-21-1940  Age: 80 y.o. MRN: [id number]  CC: This man comes in to discuss his blood work and further recommendations. HPI Since the last time I saw him, he had an accidental fall try to take care of one of the horses that they have but he has not sustained any major injuries. I discussed all the blood test with him including a normal PSA, prediabetic state with hemoglobin A1c increasing to 6% and testosterone levels which are normal but suboptimal.  Past Medical History:  Diagnosis Date  . HLD (hyperlipidemia) 01/20/2019  . Murmur   . Prediabetes 01/20/2019  . Retinal detachment    Right  . Vitamin D deficiency disease 01/20/2019   Past Surgical History:  Procedure Laterality Date  . ANKLE ARTHROPLASTY  1970  . cataract Bilateral   . FOOT SURGERY    . LACRIMAL GLAND BIOPSY    . other  2008   Parotid gland stone removal   . RETINAL DETACHMENT SURGERY Right      Family History  Problem Relation Age of Onset  . Cancer Father        laryngal cancer  . Diabetes Sister   . Osteoporosis Brother   . Heart disease Other     Social History   Social History Narrative  . Not on file   Social History   Tobacco Use  . Smoking status: Never Smoker  . Smokeless tobacco: Never Used  Substance Use Topics  . Alcohol use: Yes    Alcohol/week: 2.0 standard drinks    Types: 2 Cans of beer per week    Comment: Every day    Current Meds  Medication Sig  . amLODipine (NORVASC) 2.5 MG tablet TAKE 1 TABLET(2.5 MG) BY MOUTH DAILY  . Ascorbic Acid (VITAMIN C) 500 MG CAPS Take 1 capsule by mouth daily as needed.  .  Cholecalciferol (VITAMIN D) 125 MCG (5000 UT) CAPS Take 5,000 Units by mouth daily.   Bertram Gala Glycol-Propyl Glycol (SYSTANE) 0.4-0.3 % SOLN Apply 1 drop to eye daily.  . RESTASIS 0.05 % ophthalmic emulsion as needed.  . Testosterone 20 % CREA Apply 100 mg topically daily at 12 noon.      Depression screen [facility] 2/9 05/26/2020 05/03/2020 11/12/2019 09/15/2019 07/15/2019  Decreased Interest 0 0 3 0 0  Down, Depressed, Hopeless 0 1 0 0 0  PHQ - 2 Score 0 1 3 0 0  Altered sleeping 0 1 0 - -  Tired, decreased energy 0 1 1 - -  Change in appetite 0 0 0 - -  Feeling bad or failure about yourself  0 0 0 - -  Trouble concentrating 0 0 0 - -  Moving slowly or fidgety/restless - 0 0 - -  Suicidal thoughts 0 0 0 - -  PHQ-9 Score 0 3 4 - -  Difficult doing work/chores Not difficult at all Not difficult at all - - -     Objective:   Today's Vitals: BP (!) 145/80 (BP Location: Right Arm, Patient Position: Sitting, Cuff Size: Normal)   Pulse 89   Temp 97.6 F (36.4  C) (Temporal)   Resp 19   Ht 6\' 1"  (1.854 m)   Wt 197 lb (89.4 kg)   SpO2 97%   BMI 25.99 kg/m  Vitals with BMI 05/26/2020 05/03/2020 03/22/2020  Height 6\' 1"  6\' 1"  (No Data)  Weight 197 lbs 196 lbs 13 oz (No Data)  BMI 26 25.97 -  Systolic 145 134 (No Data)  Diastolic 80 80 (No Data)  Pulse 89 68 -     Physical Exam  He looks systemically well. Blood pressure somewhat elevated today for him. No other new physical findings.     Assessment   1. Decreased sex drive       Tests ordered No orders of the defined types were placed in this encounter.    Plan: 1. After full discussion and shared decision making, he wants to proceed with testosterone therapy with testosterone cream. He is aware of FDA warnings, benefits and side effects and he will let me know if he has problems. I have called in a prescription to 03/24/2020 apothecary for 20% testosterone cream, apply 2 clicks to the scrotal skin daily which is a dose of  100 mg applied to the scrotal skin daily. 2. Follow-up in 4 to 6 weeks time to see how he is doing.   Meds ordered this encounter  Medications  . Testosterone 20 % CREA    Sig: Apply 100 mg topically daily at 12 noon.    Dispense:  100 g    Refill:  0    Hau Sanor , MD

## 2020-06-09 ENCOUNTER — Encounter (INDEPENDENT_AMBULATORY_CARE_PROVIDER_SITE_OTHER): Payer: Self-pay | Admitting: Nurse Practitioner

## 2020-06-09 ENCOUNTER — Ambulatory Visit (INDEPENDENT_AMBULATORY_CARE_PROVIDER_SITE_OTHER): Payer: Medicare HMO | Admitting: Nurse Practitioner

## 2020-06-09 ENCOUNTER — Other Ambulatory Visit: Payer: Self-pay

## 2020-06-09 VITALS — BP 160/80 | HR 67 | Temp 97.1°F | Ht 74.0 in | Wt 200.0 lb

## 2020-06-09 DIAGNOSIS — Z0000 Encounter for general adult medical examination without abnormal findings: Secondary | ICD-10-CM | POA: Diagnosis not present

## 2020-06-09 NOTE — Patient Instructions (Addendum)
  Joel Guerra , Thank you for taking time to come for your Medicare Wellness Visit. I appreciate your ongoing commitment to your health goals. Please review the following plan we discussed and let me know if I can assist you in the future.   These are the goals we discussed: Goals    . Increase physical activity       This is a list of the screening recommended for you and due dates:  Health Maintenance  Topic Date Due  . Pneumonia vaccines (2 of 2 - PCV13) 07/29/2020*  . Tetanus Vaccine  04/25/2023  . Flu Shot  Completed  . COVID-19 Vaccine  Completed  *Topic was postponed. The date shown is not the original due date.

## 2020-06-09 NOTE — Progress Notes (Signed)
Subjective:   Joel Guerra is a 80 y.o. male who presents for Medicare Annual/Subsequent preventive examination.   Cardiac Risk Factors include: advanced age (>20men, >79 women);male gender;hypertension     Objective:    Today's Vitals   06/09/20 1052  BP: (!) 160/80  Pulse: 67  Temp: (!) 97.1 F (36.2 C)  TempSrc: Temporal  SpO2: 97%  Weight: 200 lb (90.7 kg)  Height: 6\' 2"  (1.88 m)   Body mass index is 25.68 kg/m.  Advanced Directives 06/09/2020 05/30/2019 10/04/2018 10/03/2018  Does Patient Have a Medical Advance Directive? No Yes No No  Type of Advance Directive - Out of facility DNR (pink MOST or yellow form) - -  Does patient want to make changes to medical advance directive? - No - Patient declined - -  Would patient like information on creating a medical advance directive? No - Patient declined - No - Patient declined -    Current Medications (verified) Outpatient Encounter Medications as of 06/09/2020  Medication Sig  . amLODipine (NORVASC) 2.5 MG tablet TAKE 1 TABLET(2.5 MG) BY MOUTH DAILY  . Ascorbic Acid (VITAMIN C) 500 MG CAPS Take 1 capsule by mouth daily as needed.  . Cholecalciferol (VITAMIN D) 125 MCG (5000 UT) CAPS Take 5,000 Units by mouth daily.   06/11/2020 Glycol-Propyl Glycol (SYSTANE) 0.4-0.3 % SOLN Apply 1 drop to eye daily.  . RESTASIS 0.05 % ophthalmic emulsion as needed.  . Testosterone 20 % CREA Apply 100 mg topically daily at 12 noon.   No facility-administered encounter medications on file as of 06/09/2020.    Allergies (verified) Patient has no known allergies.   History: Past Medical History:  Diagnosis Date  . HLD (hyperlipidemia) 01/20/2019  . Murmur   . Prediabetes 01/20/2019  . Retinal detachment    Right  . Vitamin D deficiency disease 01/20/2019   Past Surgical History:  Procedure Laterality Date  . ANKLE ARTHROPLASTY  1970  . cataract Bilateral   . FOOT SURGERY    . LACRIMAL GLAND BIOPSY    . other  2008   Parotid gland  stone removal   . RETINAL DETACHMENT SURGERY Right    Family History  Problem Relation Age of Onset  . Cancer Father        laryngal cancer  . Diabetes Sister   . Osteoporosis Brother   . Heart disease Other    Social History   Socioeconomic History  . Marital status: Significant Other    Spouse name: Not on file  . Number of children: Not on file  . Years of education: Not on file  . Highest education level: Not on file  Occupational History  . Not on file  Tobacco Use  . Smoking status: Never Smoker  . Smokeless tobacco: Never Used  Vaping Use  . Vaping Use: Never used  Substance and Sexual Activity  . Alcohol use: Yes    Alcohol/week: 2.0 standard drinks    Types: 2 Cans of beer per week    Comment: Every day  . Drug use: Never  . Sexual activity: Not on file  Other Topics Concern  . Not on file  Social History Narrative  . Not on file   Social Determinants of Health   Financial Resource Strain: Not on file  Food Insecurity: Not on file  Transportation Needs: Not on file  Physical Activity: Not on file  Stress: Not on file  Social Connections: Not on file    Tobacco Counseling Counseling  given: Not Answered   Clinical Intake:  Pre-visit preparation completed: Yes  Pain : No/denies pain     BMI - recorded: 25.68 Nutritional Status: BMI 25 -29 Overweight Nutritional Risks: None Diabetes: No  How often do you need to have someone help you when you read instructions, pamphlets, or other written materials from your doctor or pharmacy?: 2 - Rarely What is the last grade level you completed in school?: 11th grade  Diabetic? No  Interpreter Needed?: No  Information entered by :: Jiles Prows, NP-C   Activities of Daily Living In your present state of health, do you have any difficulty performing the following activities: 06/09/2020  Hearing? N  Vision? N  Difficulty concentrating or making decisions? N  Walking or climbing stairs? N  Dressing  or bathing? N  Doing errands, shopping? N  Preparing Food and eating ? N  Using the Toilet? N  In the past six months, have you accidently leaked urine? N  Do you have problems with loss of bowel control? N  Managing your Medications? N  Managing your Finances? N  Housekeeping or managing your Housekeeping? N  Some recent data might be hidden    Patient Care Team: Elenore Paddy, NP as PCP - General (Nurse Practitioner)  Indicate any recent Medical Services you may have received from other than [facility] providers in the past year (date may be approximate).     Assessment:   This is a routine wellness examination for Joel Guerra.  Hearing/Vision screen  Hearing Screening   125Hz  250Hz  500Hz  1000Hz  2000Hz  3000Hz  4000Hz  6000Hz  8000Hz   Right ear:           Left ear:             Visual Acuity Screening   Right eye Left eye Both eyes  Without correction: 20/200 20/30   With correction: 20/40 20/20     Dietary issues and exercise activities discussed: Current Exercise Habits: The patient does not participate in regular exercise at present, Exercise limited by: orthopedic condition(s)  Goals    . Increase physical activity      Depression Screen PHQ 2/9 Scores 06/09/2020 06/09/2020 05/26/2020 05/03/2020 11/12/2019 09/15/2019 07/15/2019  PHQ - 2 Score 0 0 0 1 3 0 0  PHQ- 9 Score 0 0 0 3 4 - -  Exception Documentation - - - - - Medical reason Medical reason    Fall Risk Fall Risk  06/09/2020 06/09/2020 05/26/2020 05/03/2020 09/15/2019  Falls in the past year? 1 0 1 1 0  Number falls in past yr: 0 0 0 0 0  Injury with Fall? 1 0 0 1 0  Comment Hip - fell helping horse with vet. Horsewas  sick &  passed away. - -  Risk for fall due to : - - History of fall(s) - No Fall Risks  Follow up Falls evaluation completed Falls evaluation completed Falls prevention discussed;Follow up appointment - Falls evaluation completed    FALL RISK PREVENTION PERTAINING TO THE HOME:  Any stairs in or around the home?  Yes  If so, are there any without handrails? No  Home free of loose throw rugs in walkways, pet beds, electrical cords, etc? Yes  Adequate lighting in your home to reduce risk of falls? Yes   ASSISTIVE DEVICES UTILIZED TO PREVENT FALLS:  Life alert? No  Use of a cane, walker or w/c? No  Grab bars in the bathroom? Yes  Shower chair or bench in shower? No  Elevated toilet seat or a handicapped toilet? No   TIMED UP AND GO:  Was the test performed? Yes .  Length of time to ambulate 10 feet: 10 sec.   Gait steady and fast without use of assistive device  Cognitive Function:     6CIT Screen 06/09/2020 05/30/2019  What Year? 0 points 0 points  What month? 0 points 0 points  What time? 0 points 0 points  Count back from 20 0 points 0 points  Months in reverse 2 points 0 points  Repeat phrase 2 points 2 points  Total Score 4 2    Immunizations Immunization History  Administered Date(s) Administered  . Fluad Quad(high Dose 65+) 02/10/2019, 02/03/2020  . Moderna Sars-Covid-2 Vaccination 06/26/2019, 08/01/2019, 04/16/2020  . Pneumococcal Polysaccharide-23 04/24/2016  . Td 04/24/2013    TDAP status: Up to date  Flu Vaccine status: Up to date  Pneumococcal vaccine status: Up to date  Covid-19 vaccine status: Completed vaccines  Qualifies for Shingles Vaccine? Yes  - Patient declines to have this administered at this time Zostavax completed No   Shingrix Completed?: No.    Education has been provided regarding the importance of this vaccine. Patient has been advised to call insurance company to determine out of pocket expense if they have not yet received this vaccine. Advised may also receive vaccine at local pharmacy or Health Dept. Verbalized acceptance and understanding.  Screening Tests Health Maintenance  Topic Date Due  . PNA vac Low Risk Adult (2 of 2 - PCV13) 07/29/2020 (Originally 04/24/2017)  . TETANUS/TDAP  04/25/2023  . INFLUENZA VACCINE  Completed  . COVID-19  Vaccine  Completed    Health Maintenance  There are no preventive care reminders to display for this patient.  Colorectal cancer screening: No longer required.   Lung Cancer Screening: (Low Dose CT Chest recommended if Age 19-80 years, 30 pack-year currently smoking OR have quit w/in 15years.) does not qualify.   Lung Cancer Screening Referral: N/A  Additional Screening:  Hepatitis C Screening: does qualify; Patient declines to be tested at this time  Vision Screening: Recommended annual ophthalmology exams for early detection of glaucoma and other disorders of the eye. Is the patient up to date with their annual eye exam?  Yes  Who is the provider or what is the name of the office in which the patient attends annual eye exams? Not sure of name, provider is in [place] If pt is not established with a provider, would they like to be referred to a provider to establish care? No .   Dental Screening: Recommended annual dental exams for proper oral hygiene  Community Resource Referral / Chronic Care Management: CRR required this visit?  No   CCM required this visit?  No      Plan:     I have personally reviewed and noted the following in the patient's chart:   . Medical and social history . Use of alcohol, tobacco or illicit drugs  . Current medications and supplements . Functional ability and status . Nutritional status . Physical activity . Advanced directives . List of other physicians . Hospitalizations, surgeries, and ER visits in previous 12 months . Vitals . Screenings to include cognitive, depression, and falls . Referrals and appointments  In addition, I have reviewed and discussed with patient certain preventive protocols, quality metrics, and best practice recommendations. A written personalized care plan for preventive services as well as general preventive health recommendations were provided to patient.  Elenore Paddy, NP   06/09/2020    Patient to  follow-up as scheduled next month and then AWV in 1 year.

## 2020-07-21 ENCOUNTER — Ambulatory Visit (INDEPENDENT_AMBULATORY_CARE_PROVIDER_SITE_OTHER): Payer: Medicare HMO | Admitting: Internal Medicine

## 2020-07-21 ENCOUNTER — Encounter (INDEPENDENT_AMBULATORY_CARE_PROVIDER_SITE_OTHER): Payer: Self-pay | Admitting: Internal Medicine

## 2020-07-21 ENCOUNTER — Other Ambulatory Visit: Payer: Self-pay

## 2020-07-21 VITALS — BP 133/70 | HR 78 | Temp 97.6°F | Resp 18 | Ht 74.0 in | Wt 204.0 lb

## 2020-07-21 DIAGNOSIS — R6882 Decreased libido: Secondary | ICD-10-CM

## 2020-07-21 DIAGNOSIS — N529 Male erectile dysfunction, unspecified: Secondary | ICD-10-CM | POA: Diagnosis not present

## 2020-07-21 IMAGING — CT CT HEAD WITHOUT CONTRAST
3 of 7 series · 15 of 47 positions shown, 18 images · non-contrast
Comparison: None.

CLINICAL DATA: Syncopal episode. Loss of consciousness. Weakness
and feeling bad.

EXAM:
CT HEAD WITHOUT CONTRAST
CT CERVICAL SPINE WITHOUT CONTRAST
TECHNIQUE: Multidetector CT imaging of the head and cervical spine was
performed following the standard protocol without intravenous
contrast. Multiplanar CT image reconstructions of the cervical spine
were also generated.

[Series 5: coronal soft · coronal · 0.30mm/px · 3 of 68 slices shown]
[im 20/68  brain]
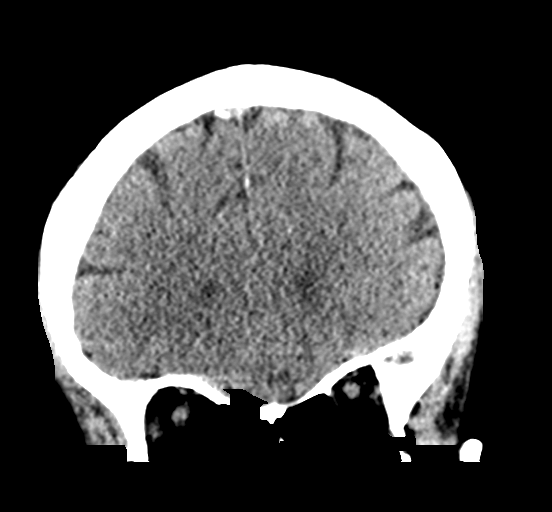
[im 29/68  brain]
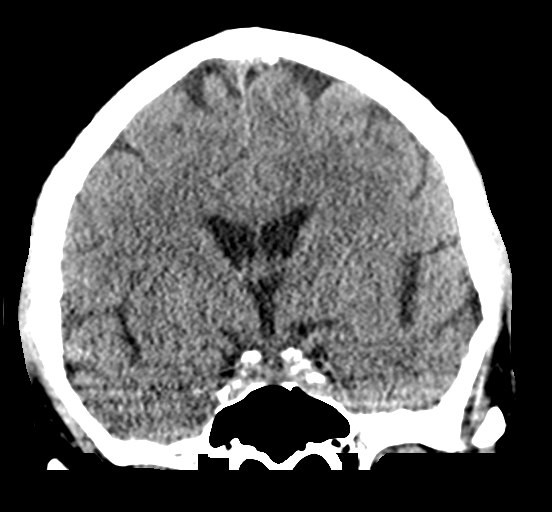
[im 39/68  brain]
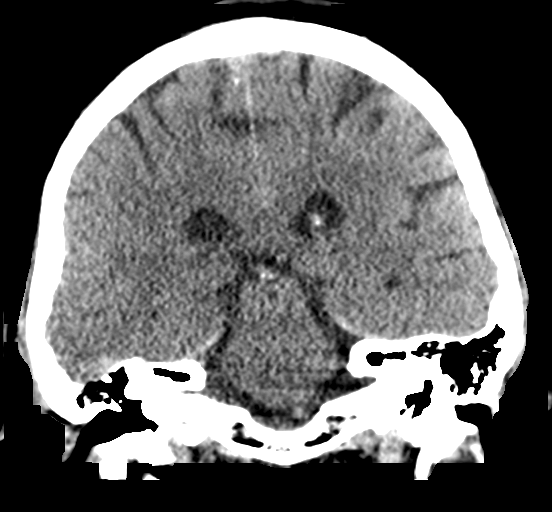

[Series 6: sagittal soft · sagittal · 0.29mm/px · 1 of 52 slices shown]
[im 26/52  brain]
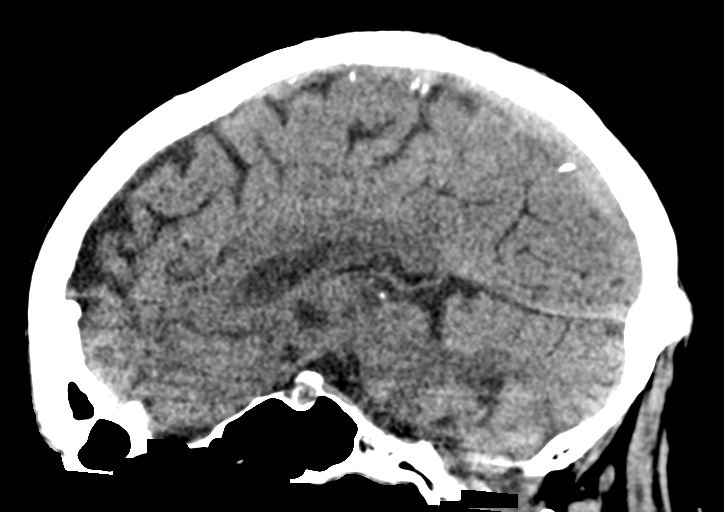

[Series 11: orthogonal axials · axial · 0.21mm/px · z∈[+47,+186]mm · 11 of 98 slices shown, 14 images]
[im 9/98  brain]
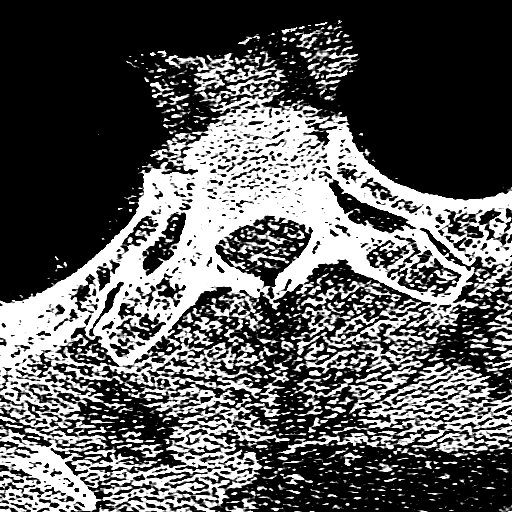
[im 9/98  bone]
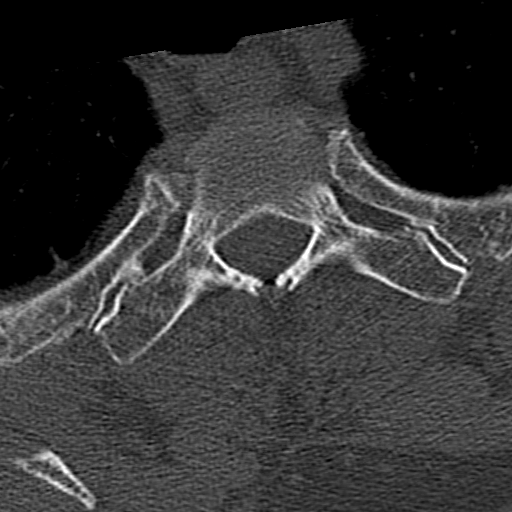
[im 17/98  brain]
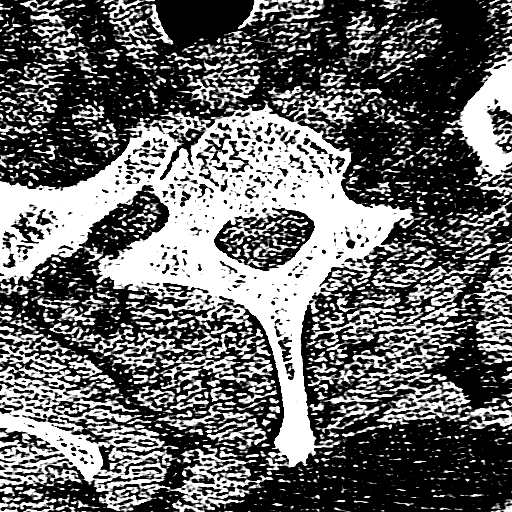
[im 25/98  brain]
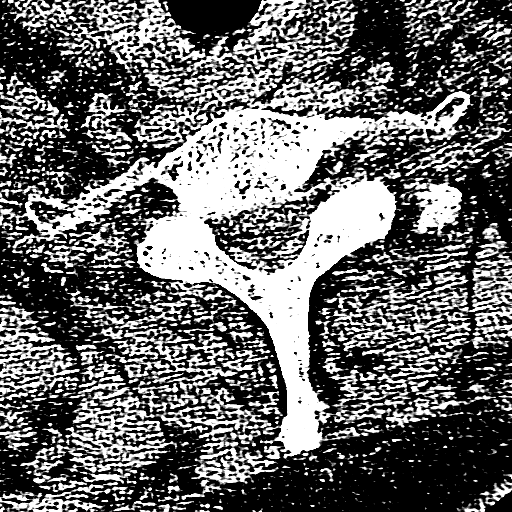
[im 33/98  brain]
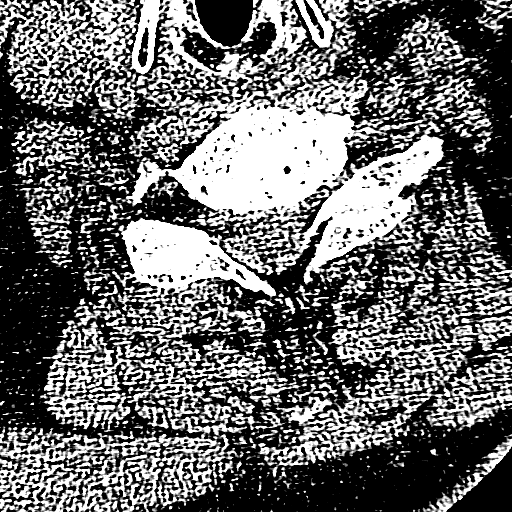
[im 41/98  brain]
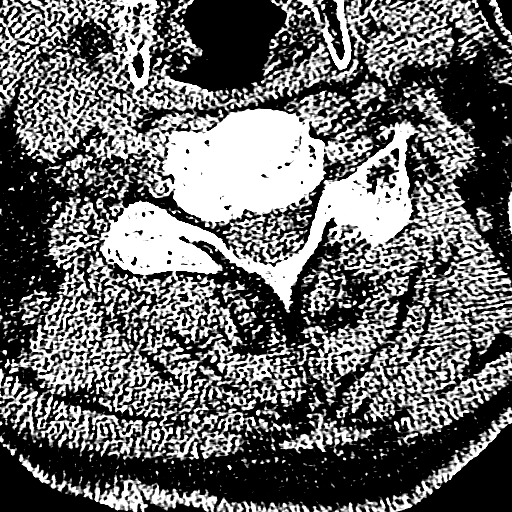
[im 41/98  bone]
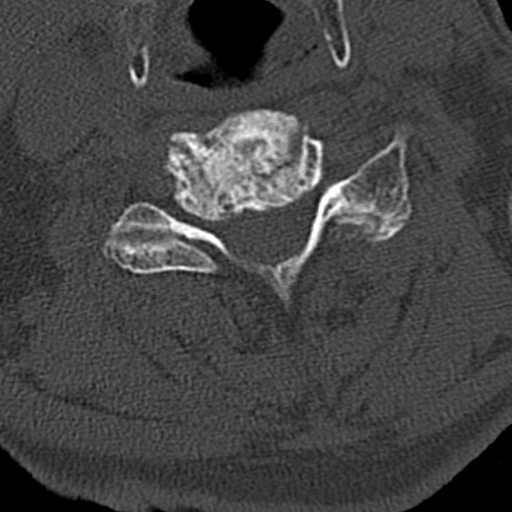
[im 49/98  brain]
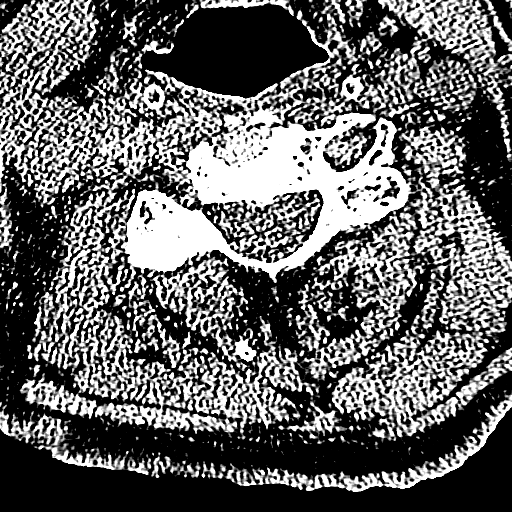
[im 57/98  brain]
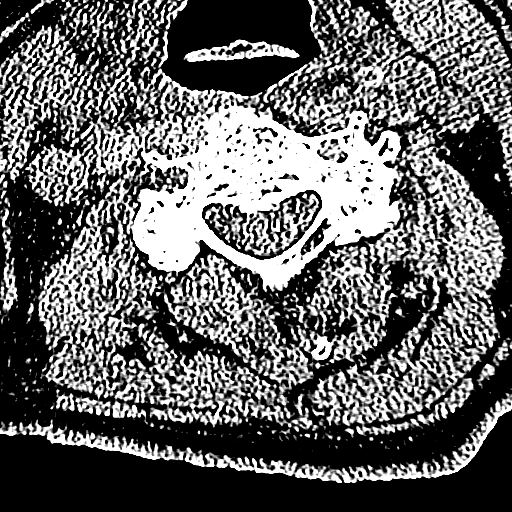
[im 65/98  brain]
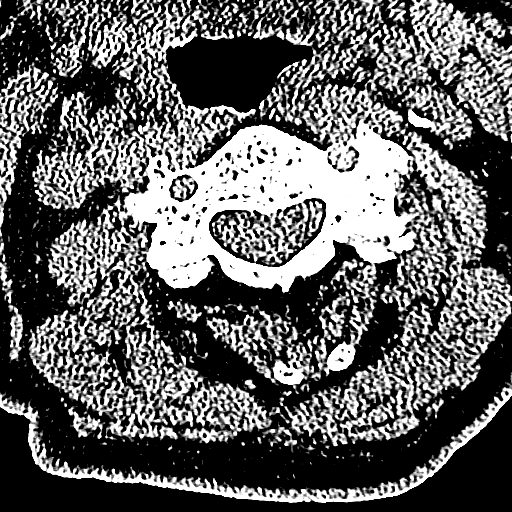
[im 73/98  brain]
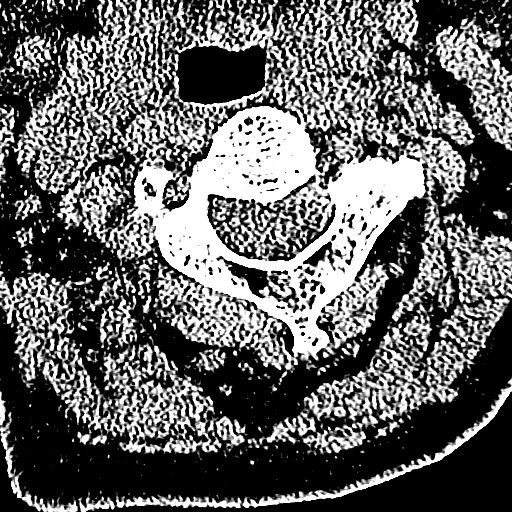
[im 73/98  bone]
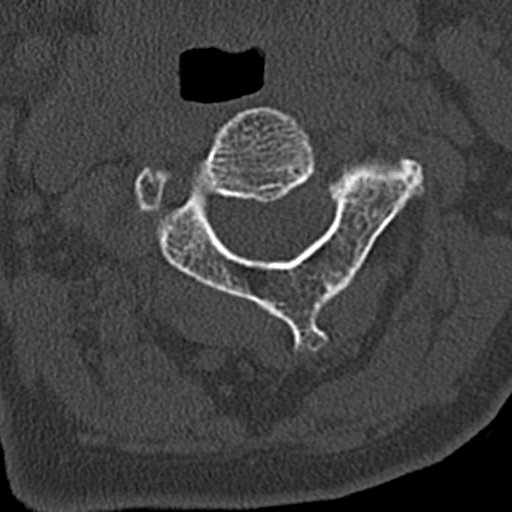
[im 81/98  brain]
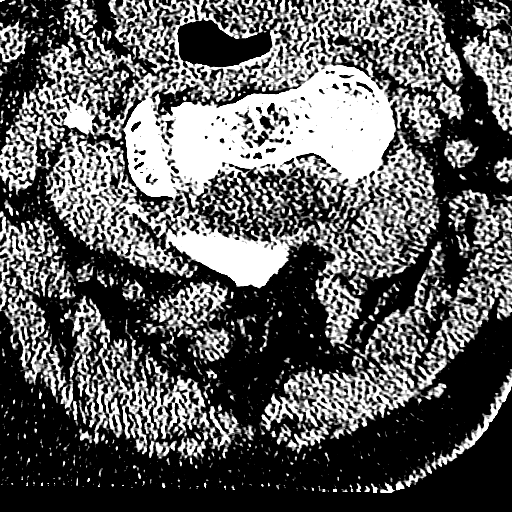
[im 89/98  brain]
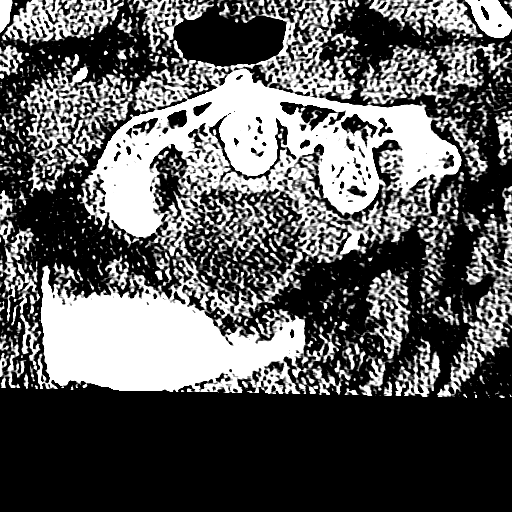

[15 of 47 positions shown; findings below may reference images not displayed]

FINDINGS: CT HEAD FINDINGS

Brain: Age related cerebral atrophy, ventriculomegaly and
periventricular white matter disease. No extra-axial fluid
collections are identified. No CT findings for acute hemispheric
infarction or intracranial hemorrhage. No mass lesions. The
brainstem and cerebellum are normal.

Vascular: Scattered vascular calcifications but no aneurysm or
hyperdense vessels.

Skull: No skull fracture or bone lesions. Mild hyperostosis
frontalis interna.

Sinuses/Orbits: The paranasal sinuses and mastoid air cells are
clear. The globes are intact.

Other: No scalp lesions or scalp hematoma.

CT CERVICAL SPINE FINDINGS

Alignment: Normal

Skull base and vertebrae: Osteoporosis and degenerative cervical
spondylosis with multilevel disc disease and facet disease. No acute
cervical spine fracture.

Soft tissues and spinal canal: No prevertebral fluid or swelling. No
visible canal hematoma.

Disc levels: The spinal canal is fairly generous. No significant
spinal or foraminal stenosis

Upper chest: The lung apices are grossly clear.

Other: No neck mass or adenopathy.  Scattered neck nodes are noted.
IMPRESSION: 1. No acute intracranial findings or skull fracture.
2. Degenerative cervical spondylosis but no acute cervical spine
fracture.

## 2020-07-21 NOTE — Progress Notes (Signed)
Metrics: Intervention Frequency ACO  Documented Smoking Status Yearly  Screened one or more times in 24 months  Cessation Counseling or  Active cessation medication Past 24 months  Past 24 months   Guideline developer: UpToDate (See UpToDate for funding source) Date Released: 2014       Wellness Office Visit  Subjective:  Patient ID: Joel Guerra, male    DOB: Apr 01, 1941  Age: 80 y.o. MRN: [id number]  CC: This man comes in for follow-up of decreased libido and testosterone therapy. HPI  He feels that the testosterone he has been taking has been helping him to increase his libido but he still is having erectile dysfunction problems. Past Medical History:  Diagnosis Date  . HLD (hyperlipidemia) 01/20/2019  . Murmur   . Prediabetes 01/20/2019  . Retinal detachment    Right  . Vitamin D deficiency disease 01/20/2019   Past Surgical History:  Procedure Laterality Date  . ANKLE ARTHROPLASTY  1970  . cataract Bilateral   . FOOT SURGERY    . LACRIMAL GLAND BIOPSY    . other  2008   Parotid gland stone removal   . RETINAL DETACHMENT SURGERY Right      Family History  Problem Relation Age of Onset  . Cancer Father        laryngal cancer  . Diabetes Sister   . Osteoporosis Brother   . Heart disease Other     Social History   Social History Narrative  . Not on file   Social History   Tobacco Use  . Smoking status: Never Smoker  . Smokeless tobacco: Never Used  Substance Use Topics  . Alcohol use: Yes    Alcohol/week: 2.0 standard drinks    Types: 2 Cans of beer per week    Comment: Every day    Current Meds  Medication Sig  . amLODipine (NORVASC) 2.5 MG tablet TAKE 1 TABLET(2.5 MG) BY MOUTH DAILY  . Ascorbic Acid (VITAMIN C) 500 MG CAPS Take 1 capsule by mouth daily as needed.  . Cholecalciferol (VITAMIN D) 125 MCG (5000 UT) CAPS Take 5,000 Units by mouth daily.   Bertram Gala Glycol-Propyl Glycol (SYSTANE) 0.4-0.3 % SOLN Apply 1 drop to eye daily.  . RESTASIS  0.05 % ophthalmic emulsion as needed.  . Testosterone 20 % CREA Apply 100 mg topically daily at 12 noon.     Flowsheet Row Clinical Support from 06/09/2020 in [facility]  PHQ-9 Total Score 0      Objective:   Today's Vitals: BP 133/70 (BP Location: Left Arm, Patient Position: Sitting, Cuff Size: Normal)   Pulse 78   Temp 97.6 F (36.4 C)   Resp 18   Ht 6\' 2"  (1.88 m)   Wt 204 lb (92.5 kg)   SpO2 96%   BMI 26.19 kg/m  Vitals with BMI 07/21/2020 06/09/2020 05/26/2020  Height 6\' 2"  6\' 2"  6\' 1"   Weight 204 lbs 200 lbs 197 lbs  BMI 26.18 25.67 26  Systolic 133 160 07/24/2020  Diastolic 70 80 80  Pulse 78 67 89     Physical Exam A look systemically well.      Assessment   1. Decreased sex drive   2. Erectile dysfunction, unspecified erectile dysfunction type       Tests ordered No orders of the defined types were placed in this encounter.    Plan: 1. I think that his testosterone levels may not be sufficiently elevated at this point so I have told him  to increase the dose of testosterone cream to 2 clicks twice a day which would represent 100 mg twice a day.  I did offer the possible use of specific erectile dysfunction medication such as Viagra or Cialis but he would like to hold off due to cost. 2. Follow-up in about 2 months to see how he is doing and we will likely check testosterone levels at that time.   No orders of the defined types were placed in this encounter.   Wilson Singer, MD

## 2020-07-26 ENCOUNTER — Telehealth (INDEPENDENT_AMBULATORY_CARE_PROVIDER_SITE_OTHER): Payer: Self-pay | Admitting: Internal Medicine

## 2020-07-26 ENCOUNTER — Other Ambulatory Visit (INDEPENDENT_AMBULATORY_CARE_PROVIDER_SITE_OTHER): Payer: Self-pay | Admitting: Internal Medicine

## 2020-07-26 MED ORDER — TESTOSTERONE 20 % CREA: 100.0000 mg | TOPICAL_CREAM | Freq: Two times a day (BID) | 2 refills | Status: DC

## 2020-07-27 NOTE — Telephone Encounter (Signed)
Done

## 2020-09-24 ENCOUNTER — Other Ambulatory Visit (INDEPENDENT_AMBULATORY_CARE_PROVIDER_SITE_OTHER): Payer: Self-pay | Admitting: Internal Medicine

## 2020-09-24 DIAGNOSIS — I10 Essential (primary) hypertension: Secondary | ICD-10-CM

## 2020-10-06 ENCOUNTER — Ambulatory Visit (INDEPENDENT_AMBULATORY_CARE_PROVIDER_SITE_OTHER): Payer: Medicare HMO | Admitting: Internal Medicine

## 2020-10-18 ENCOUNTER — Ambulatory Visit
Admission: EM | Admit: 2020-10-18 | Discharge: 2020-10-18 | Disposition: A | Discharge status: Home / Self Care | Payer: Medicare HMO

## 2020-10-18 ENCOUNTER — Other Ambulatory Visit: Payer: Self-pay

## 2020-10-18 DIAGNOSIS — M79604 Pain in right leg: Secondary | ICD-10-CM

## 2020-10-18 DIAGNOSIS — M25572 Pain in left ankle and joints of left foot: Secondary | ICD-10-CM | POA: Diagnosis not present

## 2020-10-18 DIAGNOSIS — M25571 Pain in right ankle and joints of right foot: Secondary | ICD-10-CM | POA: Diagnosis not present

## 2020-10-18 MED ORDER — PREDNISONE 20 MG PO TABS: 20.0000 mg | ORAL_TABLET | Freq: Every day | ORAL | 0 refills | Status: AC

## 2020-10-18 MED ORDER — DEXAMETHASONE SODIUM PHOSPHATE 10 MG/ML IJ SOLN
10.0000 mg | Freq: Once | INTRAMUSCULAR | Status: AC
  Administered 2020-10-18: 10 mg via INTRAMUSCULAR
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
## 2020-11-11 ENCOUNTER — Encounter (INDEPENDENT_AMBULATORY_CARE_PROVIDER_SITE_OTHER): Payer: Self-pay | Admitting: Internal Medicine

## 2020-11-11 ENCOUNTER — Ambulatory Visit (INDEPENDENT_AMBULATORY_CARE_PROVIDER_SITE_OTHER): Payer: Medicare HMO | Admitting: Internal Medicine

## 2020-11-11 ENCOUNTER — Other Ambulatory Visit: Payer: Self-pay

## 2020-11-11 VITALS — BP 120/76 | HR 69 | Temp 97.5°F | Ht 73.0 in | Wt 201.0 lb

## 2020-11-11 DIAGNOSIS — R7303 Prediabetes: Secondary | ICD-10-CM | POA: Diagnosis not present

## 2020-11-11 DIAGNOSIS — N401 Enlarged prostate with lower urinary tract symptoms: Secondary | ICD-10-CM

## 2020-11-11 DIAGNOSIS — R3914 Feeling of incomplete bladder emptying: Secondary | ICD-10-CM

## 2020-11-11 DIAGNOSIS — I10 Essential (primary) hypertension: Secondary | ICD-10-CM | POA: Diagnosis not present

## 2020-11-11 DIAGNOSIS — R6882 Decreased libido: Secondary | ICD-10-CM | POA: Diagnosis not present

## 2020-11-11 DIAGNOSIS — N529 Male erectile dysfunction, unspecified: Secondary | ICD-10-CM

## 2020-11-11 DIAGNOSIS — R319 Hematuria, unspecified: Secondary | ICD-10-CM

## 2020-11-11 MED ORDER — TADALAFIL 5 MG PO TABS: 5.0000 mg | ORAL_TABLET | Freq: Every day | ORAL | 3 refills | Status: AC

## 2020-11-11 MED ORDER — TESTOSTERONE 20 % CREA: 100.0000 mg | TOPICAL_CREAM | Freq: Two times a day (BID) | 3 refills | Status: DC

## 2020-11-11 NOTE — Progress Notes (Signed)
Metrics: Intervention Frequency ACO  Documented Smoking Status Yearly  Screened one or more times in 24 months  Cessation Counseling or  Active cessation medication Past 24 months  Past 24 months   Guideline developer: UpToDate (See UpToDate for funding source) Date Released: 2014       Wellness Office Visit  Subjective:  Patient ID: Joel Guerra, male    DOB: 11/14/1940  Age: 80 y.o. MRN: [id number]  CC: This man comes in for follow-up of hypertension, erectile dysfunction, testosterone therapy, prediabetes. HPI  He previously has had history of BPH and continues to have more symptoms regarding urinary stream.  He does not get a good urinary stream.  However, he does not appear to have increased frequency of micturition or any significant nocturia. He tells me that when he does take testosterone cream, 2 clicks twice a day, it helps him to be more energized and also helps his erectile dysfunction. Lately, he feels that his urine is very dark, almost bloody. He also is wanting me to check a skin lesion on the right side of his chest. Past Medical History:  Diagnosis Date   HLD (hyperlipidemia) 01/20/2019   Murmur    Prediabetes 01/20/2019   Retinal detachment    Right   Vitamin D deficiency disease 01/20/2019   Past Surgical History:  Procedure Laterality Date   ANKLE ARTHROPLASTY  1970   cataract Bilateral    FOOT SURGERY     LACRIMAL GLAND BIOPSY     other  2008   Parotid gland stone removal    RETINAL DETACHMENT SURGERY Right      Family History  Problem Relation Age of Onset   Cancer Father        laryngal cancer   Diabetes Sister    Osteoporosis Brother    Heart disease Other     Social History   Social History Narrative   Not on file   Social History   Tobacco Use   Smoking status: Never   Smokeless tobacco: Never  Substance Use Topics   Alcohol use: Yes    Alcohol/week: 2.0 standard drinks    Types: 2 Cans of beer per week    Comment: Every day     Current Meds  Medication Sig   amLODipine (NORVASC) 2.5 MG tablet TAKE 1 TABLET(2.5 MG) BY MOUTH DAILY   Ascorbic Acid (VITAMIN C) 500 MG CAPS Take 1 capsule by mouth daily as needed.   aspirin 81 MG chewable tablet Chew 81 mg by mouth daily.   Cholecalciferol (VITAMIN D) 125 MCG (5000 UT) CAPS Take 5,000 Units by mouth daily.    Polyethyl Glycol-Propyl Glycol (SYSTANE) 0.4-0.3 % SOLN Apply 1 drop to eye daily.   RESTASIS 0.05 % ophthalmic emulsion as needed.   tadalafil (CIALIS) 5 MG tablet Take 1 tablet (5 mg total) by mouth daily.   [DISCONTINUED] Testosterone 20 % CREA Apply 100 mg topically 2 (two) times daily.     Flowsheet Row Office Visit from 11/11/2020 in [facility]  PHQ-9 Total Score 0       Objective:   Today's Vitals: BP 120/76   Pulse 69   Temp (!) 97.5 F (36.4 C) (Temporal)   Ht 6\' 1"  (1.854 m)   Wt 201 lb (91.2 kg)   SpO2 94%   BMI 26.52 kg/m  Vitals with BMI 11/11/2020 10/18/2020 07/21/2020  Height 6\' 1"  - 6\' 2"   Weight 201 lbs - 204 lbs  BMI 26.52 - 26.18  Systolic 120 131 202  Diastolic 76 72 70  Pulse 69 58 78     Physical Exam  He looks systemically well.  Blood pressure is excellent.  The skin lesion on the right anterior chest above the right nipple is a 0.5 cm well-circumscribed raised lesion, pink in color, does not look malignant.     Assessment   1. Essential hypertension   2. Erectile dysfunction, unspecified erectile dysfunction type   3. Decreased sex drive   4. Prediabetes   5. Benign prostatic hyperplasia with incomplete bladder emptying   6. Hematuria, unspecified type       Tests ordered Orders Placed This Encounter  Procedures   Urinalysis w microscopic + reflex cultur      Plan: 1.  I reassured him regarding the skin lesion but he can certainly have this excised if he wishes to and I have told him to keep an eye on this. 2.  We will check a urinalysis to make sure there is no blood in the urine. 3.   As far as his BPH symptoms are concerned, we will try him on Cialis. 4.  I have urged him to continue with testosterone therapy cream on a regular daily basis because he tends to not do it consistently. 5.  I will see him in about 3 months and we will do an annual physical exam then with all the blood work.    Meds ordered this encounter  Medications   tadalafil (CIALIS) 5 MG tablet    Sig: Take 1 tablet (5 mg total) by mouth daily.    Dispense:  30 tablet    Refill:  3   Testosterone 20 % CREA    Sig: Apply 100 mg topically 2 (two) times daily.    Dispense:  30 g    Refill:  3     Jarrel Knoke Normajean Glasgow, MD

## 2020-11-12 LAB — URINALYSIS W MICROSCOPIC + REFLEX CULTURE
Bacteria, UA: NONE SEEN /HPF
Bilirubin Urine: NEGATIVE
Glucose, UA: NEGATIVE
Hgb urine dipstick: NEGATIVE
Hyaline Cast: NONE SEEN /LPF
Ketones, ur: NEGATIVE
Leukocyte Esterase: NEGATIVE
Nitrites, Initial: NEGATIVE
Protein, ur: NEGATIVE
RBC / HPF: NONE SEEN /HPF (ref 0–2)
Specific Gravity, Urine: 1.013 (ref 1.001–1.035)
Squamous Epithelial / HPF: NONE SEEN /HPF (ref <=5)
WBC, UA: NONE SEEN /HPF (ref 0–5)
pH: 7 (ref 5.0–8.0)

## 2020-11-12 LAB — NO CULTURE INDICATED

## 2020-11-15 ENCOUNTER — Other Ambulatory Visit (INDEPENDENT_AMBULATORY_CARE_PROVIDER_SITE_OTHER): Payer: Self-pay | Admitting: Internal Medicine

## 2021-03-09 ENCOUNTER — Encounter (INDEPENDENT_AMBULATORY_CARE_PROVIDER_SITE_OTHER): Payer: Medicare HMO | Admitting: Internal Medicine

## 2021-04-26 ENCOUNTER — Other Ambulatory Visit (HOSPITAL_COMMUNITY): Payer: Self-pay | Admitting: Gerontology

## 2021-04-26 ENCOUNTER — Ambulatory Visit (HOSPITAL_COMMUNITY)
Admission: RE | Admit: 2021-04-26 | Discharge: 2021-04-26 | Disposition: A | Discharge status: Home / Self Care | Payer: Medicare Other | Source: Ambulatory Visit | Attending: Gerontology | Admitting: Gerontology

## 2021-04-26 ENCOUNTER — Other Ambulatory Visit: Payer: Self-pay

## 2021-04-26 DIAGNOSIS — R059 Cough, unspecified: Secondary | ICD-10-CM | POA: Diagnosis present

## 2021-05-28 ENCOUNTER — Ambulatory Visit
Admission: EM | Admit: 2021-05-28 | Discharge: 2021-05-28 | Disposition: A | Discharge status: Home / Self Care | Payer: Medicare Other | Attending: Urgent Care | Admitting: Urgent Care

## 2021-05-28 ENCOUNTER — Other Ambulatory Visit: Payer: Self-pay

## 2021-05-28 ENCOUNTER — Encounter: Payer: Self-pay | Admitting: Emergency Medicine

## 2021-05-28 DIAGNOSIS — I10 Essential (primary) hypertension: Secondary | ICD-10-CM

## 2021-05-28 DIAGNOSIS — U071 COVID-19: Secondary | ICD-10-CM

## 2021-05-28 DIAGNOSIS — R030 Elevated blood-pressure reading, without diagnosis of hypertension: Secondary | ICD-10-CM

## 2021-05-28 DIAGNOSIS — R052 Subacute cough: Secondary | ICD-10-CM

## 2021-05-28 DIAGNOSIS — Z9189 Other specified personal risk factors, not elsewhere classified: Secondary | ICD-10-CM

## 2021-05-28 HISTORY — DX: Essential (primary) hypertension: I10

## 2021-05-28 MED ORDER — PAXLOVID (300/100) 20 X 150 MG & 10 X 100MG PO TBPK: 1.0000 | ORAL_TABLET | Freq: Two times a day (BID) | ORAL | 0 refills | Status: DC

## 2021-05-28 MED ORDER — PROMETHAZINE-DM 6.25-15 MG/5ML PO SYRP: 5.0000 mL | ORAL_SOLUTION | Freq: Every evening | ORAL | 0 refills | Status: DC | PRN

## 2021-05-28 MED ORDER — BENZONATATE 100 MG PO CAPS: 100.0000 mg | ORAL_CAPSULE | Freq: Three times a day (TID) | ORAL | 0 refills | Status: DC | PRN

## 2021-05-28 NOTE — ED Triage Notes (Signed)
Cold chills last night.  States he just doesn't feel good.  Diarrhea last night.  C/o headache.

## 2021-05-28 NOTE — ED Provider Notes (Signed)
[facility]   MRN: [id number] DOB: Jun 26, 1940  Subjective:   Joel Guerra is a 81 y.o. male presenting for 1 day history of acute onset malaise, fatigue, sinus headaches, diarrhea.  Patient did a COVID test twice this morning and was positive.  He has actually been seen in the prior month for persistent cough after having a cough for ~2 months.  He had a chest x-ray that was done and was negative.  No chest pain, shortness of breath or wheezing.  No history of asthma.  Patient is not a smoker.  He does have a history of hypertension and only takes amlodipine 2.5 mg daily.  No confusion, weakness, numbness or tingling.  No current facility-administered medications for this encounter.  Current Outpatient Medications:    amLODipine (NORVASC) 2.5 MG tablet, TAKE 1 TABLET(2.5 MG) BY MOUTH DAILY, Disp: 90 tablet, Rfl: 0   Ascorbic Acid (VITAMIN C) 500 MG CAPS, Take 1 capsule by mouth daily as needed., Disp: , Rfl:    aspirin 81 MG chewable tablet, Chew 81 mg by mouth daily., Disp: , Rfl:    Cholecalciferol (VITAMIN D) 125 MCG (5000 UT) CAPS, Take 5,000 Units by mouth daily. , Disp: , Rfl:    Polyethyl Glycol-Propyl Glycol (SYSTANE) 0.4-0.3 % SOLN, Apply 1 drop to eye daily., Disp: , Rfl:    RESTASIS 0.05 % ophthalmic emulsion, as needed., Disp: , Rfl:    tadalafil (CIALIS) 5 MG tablet, Take 1 tablet (5 mg total) by mouth daily., Disp: 30 tablet, Rfl: 3   Testosterone 20 % CREA, Apply 100 mg topically 2 (two) times daily., Disp: 30 g, Rfl: 3   No Known Allergies  Past Medical History:  Diagnosis Date   HLD (hyperlipidemia) 01/20/2019   Hypertension    Murmur    Prediabetes 01/20/2019   Retinal detachment    Right   Vitamin D deficiency disease 01/20/2019     Past Surgical History:  Procedure Laterality Date   ANKLE ARTHROPLASTY  1970   cataract Bilateral    FOOT SURGERY     LACRIMAL GLAND BIOPSY     other  2008   Parotid gland stone removal    RETINAL DETACHMENT  SURGERY Right     Family History  Problem Relation Age of Onset   Cancer Father        laryngal cancer   Diabetes Sister    Osteoporosis Brother    Heart disease Other     Social History   Tobacco Use   Smoking status: Never   Smokeless tobacco: Never  Vaping Use   Vaping Use: Never used  Substance Use Topics   Alcohol use: Yes    Alcohol/week: 2.0 standard drinks    Types: 2 Cans of beer per week    Comment: Every day   Drug use: Never    ROS   Objective:   Vitals: BP 115/66 (BP Location: Right Arm)    Pulse 79    Temp 97.6 F (36.4 C) (Oral)    Resp 18    SpO2 95%   BP Readings from Last 3 Encounters:  05/28/21 115/66  11/11/20 120/76  10/18/20 131/72   Physical Exam Constitutional:      General: He is not in acute distress.    Appearance: Normal appearance. He is well-developed. He is not ill-appearing, toxic-appearing or diaphoretic.  HENT:     Head: Normocephalic and atraumatic.     Right Ear: External ear normal.     Left  Ear: External ear normal.     Nose: Nose normal.     Mouth/Throat:     Mouth: Mucous membranes are moist.  Eyes:     General: No scleral icterus.       Right eye: No discharge.        Left eye: No discharge.     Extraocular Movements: Extraocular movements intact.  Cardiovascular:     Rate and Rhythm: Normal rate and regular rhythm.     Heart sounds: Normal heart sounds. No murmur heard.   No friction rub. No gallop.  Pulmonary:     Effort: Pulmonary effort is normal. No respiratory distress.     Breath sounds: Normal breath sounds. No stridor. No wheezing, rhonchi or rales.  Neurological:     Mental Status: He is alert and oriented to person, place, and time.  Psychiatric:        Mood and Affect: Mood normal.        Behavior: Behavior normal.        Thought Content: Thought content normal.    Assessment and Plan :   PDMP not reviewed this encounter.  1. COVID-19   2. At increased risk of exposure to COVID-19 virus    3. Subacute cough   4. Essential hypertension   5. Elevated blood pressure reading    I will have patient's start Paxlovid as a COVID antiviral.  He has no contraindications for this.  Has never had any decreased kidney function.  Recommended that he hold off on his amlodipine as it is a low dose and he could get hypotension. Counseled patient on potential for adverse effects with medications prescribed/recommended today, ER and return-to-clinic precautions discussed, patient verbalized understanding.    Wallis Bamberg, [place] 05/28/21 1157

## 2021-05-28 NOTE — Discharge Instructions (Addendum)
Please hold of on taking amlodipine as its levels can be raised in your body while taking Paxlovid. Please just use Tylenol at a dose of 500mg -650mg  once every 6 hours as needed for your aches, pains, fevers.  Do not use any nonsteroidal anti-inflammatories (NSAIDs) like ibuprofen, Motrin, naproxen, Aleve, etc. which are all available over-the-counter.  Get plenty of fluids, rest.

## 2021-05-29 LAB — COVID-19, FLU A+B NAA
Influenza A, NAA: DETECTED — AB
Influenza B, NAA: NOT DETECTED
SARS-CoV-2, NAA: DETECTED — AB

## 2021-06-15 ENCOUNTER — Encounter (INDEPENDENT_AMBULATORY_CARE_PROVIDER_SITE_OTHER): Payer: Medicare HMO | Admitting: Nurse Practitioner

## 2021-08-22 ENCOUNTER — Other Ambulatory Visit: Payer: Self-pay

## 2021-08-22 ENCOUNTER — Ambulatory Visit
Admission: EM | Admit: 2021-08-22 | Discharge: 2021-08-22 | Disposition: A | Discharge status: Home / Self Care | Payer: Medicare Other | Attending: Family Medicine | Admitting: Family Medicine

## 2021-08-22 ENCOUNTER — Encounter: Payer: Self-pay | Admitting: Emergency Medicine

## 2021-08-22 DIAGNOSIS — J0100 Acute maxillary sinusitis, unspecified: Secondary | ICD-10-CM

## 2021-08-22 MED ORDER — PREDNISONE 20 MG PO TABS: 40.0000 mg | ORAL_TABLET | Freq: Every day | ORAL | 0 refills | Status: DC

## 2021-08-22 MED ORDER — AMOXICILLIN-POT CLAVULANATE 875-125 MG PO TABS: 1.0000 | ORAL_TABLET | Freq: Two times a day (BID) | ORAL | 0 refills | Status: DC

## 2021-08-22 NOTE — Discharge Instructions (Signed)
I recommend the NeilMed sinus rinse kit that you can buy at the drugstore, you may use this several times daily as needed to help flush out the congestion in your sinuses ?

## 2021-08-22 NOTE — ED Triage Notes (Signed)
Pt reports fatigue, headache, facial pressure, cough, and bilateral eye drainage x1 week.  ?

## 2021-08-22 NOTE — ED Provider Notes (Signed)
?[facility] ? ? ? ?CSN: [id number] ?Arrival date & time: 08/22/21  1046 ? ? ?  ? ?History   ?Chief Complaint ?Chief Complaint  ?Patient presents with  ? Cough  ? ? ?HPI ?Joel Guerra is a 81 y.o. male.  ? ?Presenting today with a week of progressively worsening fatigue, sinus headache, bilateral facial pain and pressure, cough, bilateral eye drainage.  Denies chest pain, shortness of breath, abdominal pain, nausea vomiting or diarrhea.  Symptoms started shortly after he was doing some home renovations with some old insulation.  Has been taking Zyrtec, Flonase with minimal relief. ? ? ?Past Medical History:  ?Diagnosis Date  ? HLD (hyperlipidemia) 01/20/2019  ? Hypertension   ? Murmur   ? Prediabetes 01/20/2019  ? Retinal detachment   ? Right  ? Vitamin D deficiency disease 01/20/2019  ? ? ?Patient Active Problem List  ? Diagnosis Date Noted  ? Encounter for general adult medical examination with abnormal findings 05/01/2019  ? Blurred vision, right eye 05/01/2019  ? Testicular discomfort 05/01/2019  ? Encounter to discuss test results 05/01/2019  ? Prediabetes 01/20/2019  ? HLD (hyperlipidemia) 01/20/2019  ? Vitamin D deficiency disease 01/20/2019  ? ? ?Past Surgical History:  ?Procedure Laterality Date  ? ANKLE ARTHROPLASTY  1970  ? cataract Bilateral   ? FOOT SURGERY    ? LACRIMAL GLAND BIOPSY    ? other  2008  ? Parotid gland stone removal   ? RETINAL DETACHMENT SURGERY Right   ? ? ? ? ? ?Home Medications   ? ?Prior to Admission medications   ?Medication Sig Start Date End Date Taking? Authorizing Provider  ?amoxicillin-clavulanate (AUGMENTIN) 875-125 MG tablet Take 1 tablet by mouth every 12 (twelve) hours. 08/22/21  Yes Volney American, PA-C  ?predniSONE (DELTASONE) 20 MG tablet Take 2 tablets (40 mg total) by mouth daily with breakfast. 08/22/21  Yes Volney American, PA-C  ?amLODipine (NORVASC) 2.5 MG tablet TAKE 1 TABLET(2.5 MG) BY MOUTH DAILY 09/24/20   Ailene Ards, NP  ?Ascorbic Acid  (VITAMIN C) 500 MG CAPS Take 1 capsule by mouth daily as needed.    [provider]  ?aspirin 81 MG chewable tablet Chew 81 mg by mouth daily.    [provider]  ?benzonatate (TESSALON) 100 MG capsule Take 1-2 capsules (100-200 mg total) by mouth 3 (three) times daily as needed for cough. 05/28/21   Jaynee Eagles, PA-C  ?Cholecalciferol (VITAMIN D) 125 MCG (5000 UT) CAPS Take 5,000 Units by mouth daily.     [provider]  ?nirmatrelvir & ritonavir (PAXLOVID, 300/100,) 20 x 150 MG & 10 x 100MG TBPK Take 1 tablet by mouth 2 (two) times daily. 05/28/21   Jaynee Eagles, PA-C  ?Polyethyl Glycol-Propyl Glycol (SYSTANE) 0.4-0.3 % SOLN Apply 1 drop to eye daily.    [provider]  ?promethazine-dextromethorphan (PROMETHAZINE-DM) 6.25-15 MG/5ML syrup Take 5 mLs by mouth at bedtime as needed for cough. 05/28/21   Jaynee Eagles, PA-C  ?RESTASIS 0.05 % ophthalmic emulsion as needed. 01/30/20   [provider]  ?tadalafil (CIALIS) 5 MG tablet Take 1 tablet (5 mg total) by mouth daily. 11/11/20   Doree Albee, MD  ?Testosterone 20 % CREA Apply 100 mg topically 2 (two) times daily. 11/11/20   Doree Albee, MD  ? ? ?Family History ?Family History  ?Problem Relation Age of Onset  ? Cancer Father   ?     laryngal cancer  ? Diabetes Sister   ?  Osteoporosis Brother   ? Heart disease Other   ? ? ?Social History ?Social History  ? ?Tobacco Use  ? Smoking status: Never  ? Smokeless tobacco: Never  ?Vaping Use  ? Vaping Use: Never used  ?Substance Use Topics  ? Alcohol use: Yes  ?  Alcohol/week: 2.0 standard drinks  ?  Types: 2 Cans of beer per week  ?  Comment: Every day  ? Drug use: Never  ? ? ? ?Allergies   ?Patient has no known allergies. ? ? ?Review of Systems ?Review of Systems ?Per HPI ? ?Physical Exam ?Triage Vital Signs ?ED Triage Vitals [08/22/21 1200]  ?Enc Vitals Group  ?   BP (!) 144/75  ?   Pulse Rate 75  ?   Resp 18  ?   Temp 98.1 ?F (36.7 ?C)  ?   Temp Source Oral  ?   SpO2 96 %   ?   Weight 200 lb (90.7 kg)  ?   Height '6\' 2"'  (1.88 m)  ?   Head Circumference   ?   Peak Flow   ?   Pain Score 6  ?   Pain Loc   ?   Pain Edu?   ?   Excl. in [place]?   ? ?No data found. ? ?Updated Vital Signs ?BP (!) 144/75 (BP Location: Right Arm)   Pulse 75   Temp 98.1 ?F (36.7 ?C) (Oral)   Resp 18   Ht '6\' 2"'  (1.88 m)   Wt 200 lb (90.7 kg)   SpO2 96%   BMI 25.68 kg/m?  ? ?Visual Acuity ?Right Eye Distance:   ?Left Eye Distance:   ?Bilateral Distance:   ? ?Right Eye Near:   ?Left Eye Near:    ?Bilateral Near:    ? ?Physical Exam ?Vitals and nursing note reviewed.  ?Constitutional:   ?   Appearance: He is well-developed.  ?HENT:  ?   Head: Atraumatic.  ?   Right Ear: External ear normal.  ?   Left Ear: External ear normal.  ?   Nose: Congestion present.  ?   Comments: Bilateral maxillary sinuses tender to palpation ?   Mouth/Throat:  ?   Pharynx: Posterior oropharyngeal erythema present. No oropharyngeal exudate.  ?Eyes:  ?   Conjunctiva/sclera: Conjunctivae normal.  ?   Pupils: Pupils are equal, round, and reactive to light.  ?Cardiovascular:  ?   Rate and Rhythm: Normal rate and regular rhythm.  ?Pulmonary:  ?   Effort: Pulmonary effort is normal. No respiratory distress.  ?   Breath sounds: No wheezing or rales.  ?Musculoskeletal:     ?   General: Normal range of motion.  ?   Cervical back: Normal range of motion and neck supple.  ?Lymphadenopathy:  ?   Cervical: No cervical adenopathy.  ?Skin: ?   General: Skin is warm and dry.  ?Neurological:  ?   Mental Status: He is alert and oriented to person, place, and time.  ?Psychiatric:     ?   Behavior: Behavior normal.  ? ? ? ?UC Treatments / Results  ?Labs ?(all labs ordered are listed, but only abnormal results are displayed) ?Labs Reviewed - No data to display ? ?EKG ? ? ?Radiology ?No results found. ? ?Procedures ?Procedures (including critical care time) ? ?Medications Ordered in UC ?Medications - No data to display ? ?Initial Impression / Assessment and  Plan / UC Course  ?I have reviewed the triage vital signs and the  nursing notes. ? ?Pertinent labs & imaging results that were available during my care of the patient were reviewed by me and considered in my medical decision making (see chart for details). ? ?  ? ?Treat with Augmentin, prednisone, sinus rinses, Flonase, Zyrtec.  Return for worsening symptoms. ? ?Final Clinical Impressions(s) / UC Diagnoses  ? ?Final diagnoses:  ?Acute maxillary sinusitis, recurrence not specified  ? ? ? ?Discharge Instructions   ? ?  ?I recommend the NeilMed sinus rinse kit that you can buy at the drugstore, you may use this several times daily as needed to help flush out the congestion in your sinuses ? ? ? ?ED Prescriptions   ? ? Medication Sig Dispense Auth. Provider  ? amoxicillin-clavulanate (AUGMENTIN) 875-125 MG tablet Take 1 tablet by mouth every 12 (twelve) hours. 14 tablet Volney American, [place]  ? predniSONE (DELTASONE) 20 MG tablet Take 2 tablets (40 mg total) by mouth daily with breakfast. 10 tablet Volney American, [place]  ? ?  ? ?PDMP not reviewed this encounter. ?  ?Volney American, PA-C ?08/22/21 1307 ? ?

## 2021-11-08 ENCOUNTER — Ambulatory Visit (INDEPENDENT_AMBULATORY_CARE_PROVIDER_SITE_OTHER): Payer: Medicare Other

## 2021-11-08 ENCOUNTER — Ambulatory Visit
Admission: EM | Admit: 2021-11-08 | Discharge: 2021-11-08 | Disposition: A | Discharge status: Home / Self Care | Payer: Medicare Other | Attending: Nurse Practitioner | Admitting: Nurse Practitioner

## 2021-11-08 DIAGNOSIS — M545 Low back pain, unspecified: Secondary | ICD-10-CM

## 2021-11-08 MED ORDER — TIZANIDINE HCL 2 MG PO CAPS: 2.0000 mg | ORAL_CAPSULE | Freq: Every evening | ORAL | 0 refills | Status: DC | PRN

## 2021-11-08 NOTE — ED Triage Notes (Signed)
Pt reports lower back pain, left leg "don"t work" x 2 weeks after moving something heavy . Pt think he has a disc out of pace. Pt had 2 visits to Chiropractor without relief.

## 2021-11-08 NOTE — Discharge Instructions (Addendum)
-   Back x-ray does not show any acute fracture of your low back; it shows narrowing of the disc space which is likely due to wear and tear.  It also shows a kidney stone in your right kidney and cholesterol build up. - In addition to Advil, you can take Tylenol 500 mg every 6 hours as needed.  At night time, you can take tizanidine 2 mg to help with muscle spasms/pain.  - Start back exercises as tolerated - Follow up with primary care provider if pain persist or worsens despite treatment

## 2021-11-08 NOTE — ED Provider Notes (Signed)
[facility]    CSN: [id number] Arrival date & time: 11/08/21  1235      History   Chief Complaint Chief Complaint  Patient presents with   Back Pain    HPI Joel Guerra is a 81 y.o. male.   Patient presents for back pain that started suddenly about 1 week ago.  Reports he was carrying a heavy box to his backyard and felt the pain started immediately after setting the box down on the ground.  He denies any recent fall, accident, trauma.  Reports the pain is currently a 3 out of 10, however does get severe at times.  He describes the pain as a sharp discomfort.  The pain does not radiate down either leg.  He has taken Advil which helps significantly.    He denies numbness or tingling down his leg, saddle anesthesia, new bowel or bladder incontinence, fevers, nausea/vomiting, dysuria or urinary frequency.    Patient reports a medical history significant for kidney stones, prediabetes, high cholesterol.  Reports good compliance with medication.  He denies any previous surgeries on his back but has had back "problems" in the past.     Past Medical History:  Diagnosis Date   HLD (hyperlipidemia) 01/20/2019   Hypertension    Murmur    Prediabetes 01/20/2019   Retinal detachment    Right   Vitamin D deficiency disease 01/20/2019    Patient Active Problem List   Diagnosis Date Noted   Encounter for general adult medical examination with abnormal findings 05/01/2019   Blurred vision, right eye 05/01/2019   Testicular discomfort 05/01/2019   Encounter to discuss test results 05/01/2019   Prediabetes 01/20/2019   HLD (hyperlipidemia) 01/20/2019   Vitamin D deficiency disease 01/20/2019    Past Surgical History:  Procedure Laterality Date   ANKLE ARTHROPLASTY  1970   cataract Bilateral    FOOT SURGERY     LACRIMAL GLAND BIOPSY     other  2008   Parotid gland stone removal    RETINAL DETACHMENT SURGERY Right        Home Medications    Prior to Admission  medications   Medication Sig Start Date End Date Taking? Authorizing Provider  tizanidine (ZANAFLEX) 2 MG capsule Take 1 capsule (2 mg total) by mouth at bedtime as needed for muscle spasms. Do not take with alcohol or while driving or operating heavy machinery 11/08/21  Yes Cathlean Marseilles A, NP  amLODipine (NORVASC) 2.5 MG tablet TAKE 1 TABLET(2.5 MG) BY MOUTH DAILY 09/24/20   Elenore Paddy, NP  Ascorbic Acid (VITAMIN C) 500 MG CAPS Take 1 capsule by mouth daily as needed.    [provider]  aspirin 81 MG chewable tablet Chew 81 mg by mouth daily.    [provider]  Cholecalciferol (VITAMIN D) 125 MCG (5000 UT) CAPS Take 5,000 Units by mouth daily.     [provider]  Polyethyl Glycol-Propyl Glycol (SYSTANE) 0.4-0.3 % SOLN Apply 1 drop to eye daily.    [provider]  RESTASIS 0.05 % ophthalmic emulsion as needed. 01/30/20   [provider]  tadalafil (CIALIS) 5 MG tablet Take 1 tablet (5 mg total) by mouth daily. 11/11/20   Wilson Singer, MD  Testosterone 20 % CREA Apply 100 mg topically 2 (two) times daily. 11/11/20   Wilson Singer, MD    Family History Family History  Problem Relation Age of Onset   Cancer Father  laryngal cancer   Diabetes Sister    Osteoporosis Brother    Heart disease Other     Social History Social History   Tobacco Use   Smoking status: Never   Smokeless tobacco: Never  Vaping Use   Vaping Use: Never used  Substance Use Topics   Alcohol use: Yes    Alcohol/week: 2.0 standard drinks of alcohol    Types: 2 Cans of beer per week    Comment: Every day   Drug use: Never     Allergies   Patient has no known allergies.   Review of Systems Review of Systems Per HPI  Physical Exam Triage Vital Signs ED Triage Vitals  Enc Vitals Group     BP 11/08/21 1249 (!) 156/67     Pulse Rate 11/08/21 1249 63     Resp 11/08/21 1249 18     Temp 11/08/21 1249 98.2 F (36.8 C)     Temp Source  11/08/21 1249 Oral     SpO2 11/08/21 1249 94 %     Weight --      Height --      Head Circumference --      Peak Flow --      Pain Score 11/08/21 1248 8     Pain Loc --      Pain Edu? --      Excl. in GC? --    No data found.  Updated Vital Signs BP (!) 156/67 (BP Location: Right Arm)   Pulse 63   Temp 98.2 F (36.8 C) (Oral)   Resp 18   SpO2 94%   Visual Acuity Right Eye Distance:   Left Eye Distance:   Bilateral Distance:    Right Eye Near:   Left Eye Near:    Bilateral Near:     Physical Exam Vitals and nursing note reviewed.  Constitutional:      General: He is not in acute distress.    Appearance: Normal appearance. He is not toxic-appearing.  HENT:     Mouth/Throat:     Mouth: Mucous membranes are moist.     Pharynx: Oropharynx is clear.  Pulmonary:     Effort: Pulmonary effort is normal. No respiratory distress.  Abdominal:     Tenderness: There is no right CVA tenderness or left CVA tenderness.  Musculoskeletal:        General: Tenderness present. No swelling.     Lumbar back: Tenderness present. No swelling, edema, deformity or bony tenderness. Normal range of motion. Negative right straight leg raise test and negative left straight leg raise test.       Back:     Right lower leg: No edema.     Left lower leg: No edema.     Comments: Inspection: no swelling, obvious deformity or redness to entire spines Palpation: Tender to palpation in the area marked; no obvious deformities palpated  ROM: Full ROM to back Strength: 5/5 bilateral lower extremities Neurovascular: neurovascularly intact in left and right lower extremity   Skin:    General: Skin is warm and dry.     Capillary Refill: Capillary refill takes less than 2 seconds.     Coloration: Skin is not jaundiced or pale.     Findings: No erythema.  Neurological:     Mental Status: He is alert and oriented to person, place, and time.     Motor: No weakness.     Gait: Gait normal.  Psychiatric:  Behavior: Behavior is cooperative.      UC Treatments / Results  Labs (all labs ordered are listed, but only abnormal results are displayed) Labs Reviewed - No data to display  EKG   Radiology DG Lumbar Spine Complete  Result Date: 11/08/2021 CLINICAL DATA:  Low back pain after carrying heavy box. EXAM: LUMBAR SPINE - COMPLETE 4+ VIEW COMPARISON:  None FINDINGS: Five lumbar type vertebral bodies. No malalignment. Mild disc space narrowing at L4-5 and moderate disc space narrowing at L5-S1. No regional fracture. 6 mm stone in the lower pole of the right kidney incidentally noted. Aortic atherosclerotic calcification incidentally noted. IMPRESSION: No acute radiographic finding. Disc space narrowing in the lower lumbar spine, most notable at L5-S1. 6 mm stone lower pole right kidney. Aortic atherosclerosis. Electronically Signed   By: Paulina Fusi M.D.   On: 11/08/2021 13:15    Procedures Procedures (including critical care time)  Medications Ordered in UC Medications - No data to display  Initial Impression / Assessment and Plan / UC Course  I have reviewed the triage vital signs and the nursing notes.  Pertinent labs & imaging results that were available during my care of the patient were reviewed by me and considered in my medical decision making (see chart for details).    Patient is a very pleasant, well-appearing 81 year old male presenting for acute low back pain today.  Lumbar x-ray today is negative for acute radiographic finding.  There is some disc space narrowing and a right-sided kidney stone.  Findings discussed with the patient and explained that x-ray shows "wear and tear" of the lumbar spine.  Patient endorses frequent history of kidney stones; he is not surprised that he has 1 and is not having any complications of it.  We discussed continued conservative treatment for back pain including continued Advil, Tylenol 500 mg every 6 hours as needed, and tizanidine 2 mg  at nighttime as needed for muscle pain.  Encourage starting back exercises.  Follow-up with primary care provider if pain persist or worsen despite treatment. Final Clinical Impressions(s) / UC Diagnoses   Final diagnoses:  Acute left-sided low back pain without sciatica     Discharge Instructions      - Back x-ray does not show any acute fracture of your low back; it shows narrowing of the disc space which is likely due to wear and tear.  It also shows a kidney stone in your right kidney and cholesterol build up. - In addition to Advil, you can take Tylenol 500 mg every 6 hours as needed.  At night time, you can take tizanidine 2 mg to help with muscle spasms/pain.  - Start back exercises as tolerated - Follow up with primary care provider if pain persist or worsens despite treatment    ED Prescriptions     Medication Sig Dispense Auth. Provider   tizanidine (ZANAFLEX) 2 MG capsule Take 1 capsule (2 mg total) by mouth at bedtime as needed for muscle spasms. Do not take with alcohol or while driving or operating heavy machinery 30 capsule Valentino Nose, NP      PDMP not reviewed this encounter.   Valentino Nose, NP 11/08/21 1424

## 2022-05-30 ENCOUNTER — Ambulatory Visit: Payer: Medicare Other | Admitting: Urology

## 2022-05-30 ENCOUNTER — Encounter: Payer: Self-pay | Admitting: Urology

## 2022-05-30 VITALS — BP 147/69 | HR 64

## 2022-05-30 DIAGNOSIS — R972 Elevated prostate specific antigen [PSA]: Secondary | ICD-10-CM

## 2022-05-30 DIAGNOSIS — N5201 Erectile dysfunction due to arterial insufficiency: Secondary | ICD-10-CM | POA: Diagnosis not present

## 2022-05-30 LAB — URINALYSIS, ROUTINE W REFLEX MICROSCOPIC
Bilirubin, UA: NEGATIVE
Glucose, UA: NEGATIVE
Ketones, UA: NEGATIVE
Leukocytes,UA: NEGATIVE
Nitrite, UA: NEGATIVE
Protein,UA: NEGATIVE
RBC, UA: NEGATIVE
Specific Gravity, UA: 1.015 (ref 1.005–1.030)
Urobilinogen, Ur: 0.2 mg/dL (ref 0.2–1.0)
pH, UA: 7 (ref 5.0–7.5)

## 2022-05-30 NOTE — Progress Notes (Signed)
H&P  Chief Complaint: Elevated PSA  History of Present Illness: 82 year old male sent by Cecile Sheerer, NP for evaluation and management of elevated PSA.  PSA in January, 2024 was 4.9.  No prior data is available.  He had been on testosterone repletion for short period of time when he saw Dr. Anastasio Champion.  He has not been on that transdermal cream since then, however.  He denies longstanding urologic issues.  IPSS 8, quality-of-life score 1.  He does not know if prior PSA is drawn before this year.  Past Medical History:  Diagnosis Date   HLD (hyperlipidemia) 01/20/2019   Hypertension    Murmur    Prediabetes 01/20/2019   Retinal detachment    Right   Vitamin D deficiency disease 01/20/2019    Past Surgical History:  Procedure Laterality Date   ANKLE ARTHROPLASTY  1970   cataract Bilateral    FOOT SURGERY     LACRIMAL GLAND BIOPSY     other  2008   Parotid gland stone removal    RETINAL DETACHMENT SURGERY Right     Home Medications:  Allergies as of 05/30/2022   No Known Allergies      Medication List        Accurate as of May 30, 2022  8:29 AM. If you have any questions, ask your nurse or doctor.          amLODipine 2.5 MG tablet Commonly known as: NORVASC TAKE 1 TABLET(2.5 MG) BY MOUTH DAILY   aspirin 81 MG chewable tablet Chew 81 mg by mouth daily.   Restasis 0.05 % ophthalmic emulsion Generic drug: cycloSPORINE as needed.   Systane 0.4-0.3 % Soln Generic drug: Polyethyl Glycol-Propyl Glycol Apply 1 drop to eye daily.   tadalafil 5 MG tablet Commonly known as: CIALIS Take 1 tablet (5 mg total) by mouth daily.   Testosterone 20 % Crea Apply 100 mg topically 2 (two) times daily.   tizanidine 2 MG capsule Commonly known as: ZANAFLEX Take 1 capsule (2 mg total) by mouth at bedtime as needed for muscle spasms. Do not take with alcohol or while driving or operating heavy machinery   Vitamin C 500 MG Caps Take 1 capsule by mouth daily as needed.    Vitamin D 125 MCG (5000 UT) Caps Take 5,000 Units by mouth daily.        Allergies: No Known Allergies  Family History  Problem Relation Age of Onset   Cancer Father        laryngal cancer   Diabetes Sister    Osteoporosis Brother    Heart disease Other     Social History:  reports that he has never smoked. He has never used smokeless tobacco. He reports current alcohol use of about 2.0 standard drinks of alcohol per week. He reports that he does not use drugs.  ROS: A complete review of systems was performed.  All systems are negative except for pertinent findings as noted.  Physical Exam:  Vital signs in last 24 hours: There were no vitals taken for this visit. Constitutional:  Alert and oriented, No acute distress Cardiovascular: Regular rate  Respiratory: Normal respiratory effort GI: No inguinal hernias Genitourinary: Phallus normal.  Testicles mildly atrophic.  Normal anal sphincter tone.  Prostate 50 g, symmetric, nonnodular, nontender. Lymphatic: No lymphadenopathy Neurologic: Grossly intact, no focal deficits Psychiatric: Normal mood and affect   I have reviewed notes from referring/previous physicians  I have reviewed urinalysis results  I reviewed prior PSA results  Impression/Assessment:  Slight elevation of PSA in an 82 year old male.  Normal/benign exam, appropriate for age  ED, prescription for sildenafil available but he has not used it yet  Plan:  I reassured the patient about his prostate exam as well as his PSA.  I do not think he needs further PSA screening as he is over the age of 21 and he has a normal digital rectal exam  I will have him come back in a year just for recheck for reassurance purposes

## 2023-03-12 ENCOUNTER — Ambulatory Visit
Admission: EM | Admit: 2023-03-12 | Discharge: 2023-03-12 | Disposition: A | Discharge status: Home / Self Care | Payer: Medicare Other | Attending: Nurse Practitioner | Admitting: Nurse Practitioner

## 2023-03-12 DIAGNOSIS — M545 Low back pain, unspecified: Secondary | ICD-10-CM | POA: Diagnosis not present

## 2023-03-12 LAB — POCT URINALYSIS DIP (MANUAL ENTRY)
Bilirubin, UA: NEGATIVE
Blood, UA: NEGATIVE
Glucose, UA: NEGATIVE mg/dL
Ketones, POC UA: NEGATIVE mg/dL
Leukocytes, UA: NEGATIVE
Nitrite, UA: NEGATIVE
Protein Ur, POC: NEGATIVE mg/dL
Spec Grav, UA: 1.01 (ref 1.010–1.025)
Urobilinogen, UA: 0.2 U/dL
pH, UA: 6.5 (ref 5.0–8.0)

## 2023-03-12 MED ORDER — TIZANIDINE HCL 2 MG PO CAPS: 2.0000 mg | ORAL_CAPSULE | Freq: Three times a day (TID) | ORAL | 0 refills | Status: AC | PRN

## 2023-03-12 NOTE — ED Triage Notes (Signed)
Pt reports lower left back pain x 1 week, denies any injury to the back states he has a hx of kidney stones and pain feels similar.

## 2023-03-12 NOTE — Discharge Instructions (Addendum)
The urine sample today is clear of blood or signs of infection.  I suspect the pain in your low back is a tightened or strained muscle.  Continue Tylenol and ice as needed.  Recommend staying active and light range of motion/stretching exercises.  You can take the muscle relaxant as needed for muscular pain.  Seek follow up care if pain worsens or persists longer than 6 weeks without improvement.

## 2023-03-12 NOTE — ED Provider Notes (Signed)
[facility]    CSN: [id number] Arrival date & time: 03/12/23  6213      History   Chief Complaint No chief complaint on file.   HPI Joel Guerra is a 82 y.o. male.   Patient presents for back pain that started gradually 1 week ago. Denies recent trauma, fall, accident, or injury involving left low back.  Reports the pain is moderate to severe and describes the pain as sharp and stabbing.  Pain is worse with movement, none sitting at rest.  Reports the pain does not radiate down either leg.  Has used Tylenol for the pain with minimal relief.  Denies numbness or tingling, saddle anesthesia, bowel/bladder incontinence, fevers, nausea/vomiting, and dysuria/urinary frequency.  No weakness of lower extremities or decreased sensation of lower extremities.  No history of back surgeries.  Endorses history of kidney stones where pain was similar.    Past Medical History:  Diagnosis Date   HLD (hyperlipidemia) 01/20/2019   Hypertension    Murmur    Prediabetes 01/20/2019   Retinal detachment    Right   Vitamin D deficiency disease 01/20/2019    Patient Active Problem List   Diagnosis Date Noted   Encounter for general adult medical examination with abnormal findings 05/01/2019   Blurred vision, right eye 05/01/2019   Testicular discomfort 05/01/2019   Encounter to discuss test results 05/01/2019   Prediabetes 01/20/2019   HLD (hyperlipidemia) 01/20/2019   Vitamin D deficiency disease 01/20/2019    Past Surgical History:  Procedure Laterality Date   ANKLE ARTHROPLASTY  1970   cataract Bilateral    FOOT SURGERY     LACRIMAL GLAND BIOPSY     other  2008   Parotid gland stone removal    RETINAL DETACHMENT SURGERY Right        Home Medications    Prior to Admission medications   Medication Sig Start Date End Date Taking? Authorizing Provider  amLODipine (NORVASC) 2.5 MG tablet TAKE 1 TABLET(2.5 MG) BY MOUTH DAILY Patient not taking: Reported on 05/30/2022  09/24/20   Elenore Paddy, NP  amLODipine (NORVASC) 5 MG tablet Take 5 mg by mouth daily.    [provider]  Ascorbic Acid (VITAMIN C) 500 MG CAPS Take 1 capsule by mouth daily as needed.    [provider]  aspirin 81 MG chewable tablet Chew 81 mg by mouth daily.    [provider]  Cholecalciferol (VITAMIN D) 125 MCG (5000 UT) CAPS Take 5,000 Units by mouth daily.     [provider]  Polyethyl Glycol-Propyl Glycol (SYSTANE) 0.4-0.3 % SOLN Apply 1 drop to eye daily.    [provider]  RESTASIS 0.05 % ophthalmic emulsion as needed. 01/30/20   [provider]  tadalafil (CIALIS) 5 MG tablet Take 1 tablet (5 mg total) by mouth daily. 11/11/20   Wilson Singer, MD  tizanidine (ZANAFLEX) 2 MG capsule Take 1 capsule (2 mg total) by mouth 3 (three) times daily as needed for muscle spasms. Do not take with alcohol or while driving or operating heavy machinery 03/12/23   Valentino Nose, NP    Family History Family History  Problem Relation Age of Onset   Cancer Father        laryngal cancer   Diabetes Sister    Osteoporosis Brother    Heart disease Other     Social History Social History   Tobacco Use   Smoking status: Never   Smokeless tobacco: Never  Vaping Use   Vaping status: Never Used  Substance Use Topics   Alcohol use: Yes    Alcohol/week: 2.0 standard drinks of alcohol    Types: 2 Cans of beer per week    Comment: Every day   Drug use: Never     Allergies   Patient has no known allergies.   Review of Systems Review of Systems Per HPI  Physical Exam Triage Vital Signs ED Triage Vitals  Encounter Vitals Group     BP 03/12/23 1037 (!) 156/69     Systolic BP Percentile --      Diastolic BP Percentile --      Pulse Rate 03/12/23 1037 64     Resp 03/12/23 1037 18     Temp 03/12/23 1037 98.4 F (36.9 C)     Temp Source 03/12/23 1037 Oral     SpO2 03/12/23 1037 94 %     Weight --      Height --      Head  Circumference --      Peak Flow --      Pain Score 03/12/23 1042 5     Pain Loc --      Pain Education --      Exclude from Growth Chart --    No data found.  Updated Vital Signs BP (!) 156/69 (BP Location: Right Arm)   Pulse 64   Temp 98.4 F (36.9 C) (Oral)   Resp 18   SpO2 94%   Visual Acuity Right Eye Distance:   Left Eye Distance:   Bilateral Distance:    Right Eye Near:   Left Eye Near:    Bilateral Near:     Physical Exam Vitals and nursing note reviewed.  Constitutional:      General: He is not in acute distress.    Appearance: Normal appearance. He is not toxic-appearing.  HENT:     Head: Normocephalic and atraumatic.     Mouth/Throat:     Mouth: Mucous membranes are moist.     Pharynx: Oropharynx is clear.  Pulmonary:     Effort: Pulmonary effort is normal. No respiratory distress.  Abdominal:     Tenderness: There is no right CVA tenderness or left CVA tenderness.  Musculoskeletal:       Back:     Right lower leg: No edema.     Left lower leg: No edema.     Comments: Pain as depicted  Inspection: no swelling, bruising, obvious deformity or redness to left low back Palpation: tender to palpation near left SI joint; nontender to paraspinal muscles no obvious deformities palpated ROM: Full ROM to lumbar spine, bilateral lower extremities Strength: 5/5 bilateral lower extremities Neurovascular: neurovascularly intact in bilateral lower extremities   Skin:    General: Skin is warm and dry.     Capillary Refill: Capillary refill takes less than 2 seconds.     Coloration: Skin is not jaundiced or pale.     Findings: No erythema.  Neurological:     Mental Status: He is alert and oriented to person, place, and time.     Motor: No weakness.     Gait: Gait normal.  Psychiatric:        Behavior: Behavior is cooperative.      UC Treatments / Results  Labs (all labs ordered are listed, but only abnormal results are displayed) Labs Reviewed  POCT  URINALYSIS DIP (MANUAL ENTRY)    EKG   Radiology No results  found.  Procedures Procedures (including critical care time)  Medications Ordered in UC Medications - No data to display  Initial Impression / Assessment and Plan / UC Course  I have reviewed the triage vital signs and the nursing notes.  Pertinent labs & imaging results that were available during my care of the patient were reviewed by me and considered in my medical decision making (see chart for details).   Patient is well-appearing, afebrile, not tachycardic, not tachypneic, oxygenating well on room air.  Patient is mildly hypertensive in triage today.  1. Acute left-sided low back pain without sciatica  No red flags Urinalysis negative for signs of infection or blood Suspect musculoskeletal cause Treat with muscle relaxant, Tylenol, ice/heat, light range of motion/stretching exercises Follow up precautions discussed  The patient was given the opportunity to ask questions.  All questions answered to their satisfaction.  The patient is in agreement to this plan.    Final Clinical Impressions(s) / UC Diagnoses   Final diagnoses:  Acute left-sided low back pain without sciatica     Discharge Instructions      The urine sample today is clear of blood or signs of infection.  I suspect the pain in your low back is a tightened or strained muscle.  Continue Tylenol and ice as needed.  Recommend staying active and light range of motion/stretching exercises.  You can take the muscle relaxant as needed for muscular pain.  Seek follow up care if pain worsens or persists longer than 6 weeks without improvement.    ED Prescriptions     Medication Sig Dispense Auth. Provider   tizanidine (ZANAFLEX) 2 MG capsule Take 1 capsule (2 mg total) by mouth 3 (three) times daily as needed for muscle spasms. Do not take with alcohol or while driving or operating heavy machinery 30 capsule Valentino Nose, NP      PDMP not  reviewed this encounter.   Valentino Nose, NP 03/12/23 1119

## 2023-05-02 ENCOUNTER — Emergency Department (HOSPITAL_COMMUNITY): Payer: Medicare Other

## 2023-05-02 ENCOUNTER — Emergency Department (HOSPITAL_COMMUNITY)
Admission: EM | Admit: 2023-05-02 | Discharge: 2023-05-02 | Disposition: A | Discharge status: Home / Self Care | Payer: Medicare Other | Attending: Student | Admitting: Student

## 2023-05-02 ENCOUNTER — Encounter (HOSPITAL_COMMUNITY): Payer: Self-pay

## 2023-05-02 ENCOUNTER — Other Ambulatory Visit: Payer: Self-pay

## 2023-05-02 DIAGNOSIS — Z7982 Long term (current) use of aspirin: Secondary | ICD-10-CM | POA: Diagnosis not present

## 2023-05-02 DIAGNOSIS — K5730 Diverticulosis of large intestine without perforation or abscess without bleeding: Secondary | ICD-10-CM | POA: Insufficient documentation

## 2023-05-02 DIAGNOSIS — N200 Calculus of kidney: Secondary | ICD-10-CM | POA: Diagnosis not present

## 2023-05-02 DIAGNOSIS — N320 Bladder-neck obstruction: Secondary | ICD-10-CM | POA: Insufficient documentation

## 2023-05-02 DIAGNOSIS — K449 Diaphragmatic hernia without obstruction or gangrene: Secondary | ICD-10-CM | POA: Insufficient documentation

## 2023-05-02 DIAGNOSIS — M545 Low back pain, unspecified: Secondary | ICD-10-CM | POA: Diagnosis present

## 2023-05-02 DIAGNOSIS — N400 Enlarged prostate without lower urinary tract symptoms: Secondary | ICD-10-CM | POA: Diagnosis not present

## 2023-05-02 LAB — CBC WITH DIFFERENTIAL/PLATELET
Abs Immature Granulocytes: 0.02 10*3/uL (ref 0.00–0.07)
Basophils Absolute: 0 10*3/uL (ref 0.0–0.1)
Basophils Relative: 1 %
Eosinophils Absolute: 0.2 10*3/uL (ref 0.0–0.5)
Eosinophils Relative: 3 %
HCT: 44 % (ref 39.0–52.0)
Hemoglobin: 14.9 g/dL (ref 13.0–17.0)
Immature Granulocytes: 0 %
Lymphocytes Relative: 33 %
Lymphs Abs: 1.9 10*3/uL (ref 0.7–4.0)
MCH: 31.9 pg (ref 26.0–34.0)
MCHC: 33.9 g/dL (ref 30.0–36.0)
MCV: 94.2 fL (ref 80.0–100.0)
Monocytes Absolute: 0.7 10*3/uL (ref 0.1–1.0)
Monocytes Relative: 12 %
Neutro Abs: 2.9 10*3/uL (ref 1.7–7.7)
Neutrophils Relative %: 51 %
Platelets: 257 10*3/uL (ref 150–400)
RBC: 4.67 MIL/uL (ref 4.22–5.81)
RDW: 13.4 % (ref 11.5–15.5)
WBC: 5.7 10*3/uL (ref 4.0–10.5)
nRBC: 0 % (ref 0.0–0.2)

## 2023-05-02 LAB — URINALYSIS, ROUTINE W REFLEX MICROSCOPIC
Bilirubin Urine: NEGATIVE
Glucose, UA: NEGATIVE mg/dL
Hgb urine dipstick: NEGATIVE
Ketones, ur: NEGATIVE mg/dL
Leukocytes,Ua: NEGATIVE
Nitrite: NEGATIVE
Protein, ur: NEGATIVE mg/dL
Specific Gravity, Urine: 1.006 (ref 1.005–1.030)
pH: 7 (ref 5.0–8.0)

## 2023-05-02 LAB — BASIC METABOLIC PANEL
Anion gap: 7 (ref 5–15)
BUN: 14 mg/dL (ref 8–23)
CO2: 26 mmol/L (ref 22–32)
Calcium: 9.3 mg/dL (ref 8.9–10.3)
Chloride: 101 mmol/L (ref 98–111)
Creatinine, Ser: 0.84 mg/dL (ref 0.61–1.24)
GFR, Estimated: >60 mL/min (ref >60)
Glucose, Bld: 95 mg/dL (ref 70–99)
Potassium: 4.3 mmol/L (ref 3.5–5.1)
Sodium: 134 mmol/L — ABNORMAL LOW (ref 135–145)

## 2023-05-02 MED ORDER — PREDNISONE 20 MG PO TABS
40.0000 mg | ORAL_TABLET | Freq: Once | ORAL | Status: AC
  Administered 2023-05-02: 40 mg via ORAL
  Filled 2023-05-02: qty 2

## 2023-05-02 MED ORDER — LIDOCAINE 5 % EX PTCH: 1.0000 | MEDICATED_PATCH | CUTANEOUS | 0 refills | Status: AC

## 2023-05-02 MED ORDER — FENTANYL CITRATE PF 50 MCG/ML IJ SOSY
25.0000 ug | PREFILLED_SYRINGE | Freq: Once | INTRAMUSCULAR | Status: AC
  Administered 2023-05-02: 25 ug via INTRAVENOUS
  Filled 2023-05-02: qty 1

## 2023-05-02 MED ORDER — PREDNISONE 20 MG PO TABS: 40.0000 mg | ORAL_TABLET | Freq: Every day | ORAL | 0 refills | Status: AC

## 2023-05-02 MED ORDER — LIDOCAINE 5 % EX PTCH
1.0000 | MEDICATED_PATCH | CUTANEOUS | Status: DC
  Administered 2023-05-02: 1 via TRANSDERMAL
  Filled 2023-05-02: qty 1

## 2023-05-02 NOTE — ED Triage Notes (Signed)
 Pt states he has been have lower back pain since before thanksgiving. Pt states the pain has gotten worse and is not radiating to the left side. Pt is not sure if he has injured it or not.

## 2023-05-02 NOTE — ED Provider Notes (Signed)
 [facility]GENC[facility] DEPARTMENT AT [facility] Provider Note   CSN: [id number] Arrival date & time: 05/02/23  9050     History  Chief Complaint  Patient presents with   Back Pain    Joel Guerra is a 83 y.o. male.  He has PMH of high cholesterol, prediabetes.  Presents to ER with left low back pain ongoing x 2 months.  Has been to urgent care and prescribed tizanidine  without relief, has tried chiropractic care without relief.  Pain starts in the left low back and now radiates to the left lower quadrant of his abdomen and sometimes goes into his left anterior thigh.  He denies any numbness or tingling, saddle esthesia or paresthesia, bowel or bladder incontinence.  He is able to ambulate.  Denies injury or trauma, no fevers chills or weight loss.   Back Pain      Home Medications Prior to Admission medications   Medication Sig Start Date End Date Taking? Authorizing Provider  amLODipine  (NORVASC ) 2.5 MG tablet TAKE 1 TABLET(2.5 MG) BY MOUTH DAILY Patient not taking: Reported on 05/30/2022 09/24/20   Elnor Lauraine BRAVO, NP  amLODipine  (NORVASC ) 5 MG tablet Take 5 mg by mouth daily.    [provider]  Ascorbic Acid (VITAMIN C) 500 MG CAPS Take 1 capsule by mouth daily as needed.    [provider]  aspirin 81 MG chewable tablet Chew 81 mg by mouth daily.    [provider]  Cholecalciferol (VITAMIN D ) 125 MCG (5000 UT) CAPS Take 5,000 Units by mouth daily.     [provider]  Polyethyl Glycol-Propyl Glycol (SYSTANE) 0.4-0.3 % SOLN Apply 1 drop to eye daily.    [provider]  RESTASIS 0.05 % ophthalmic emulsion as needed. 01/30/20   [provider]  tadalafil  (CIALIS ) 5 MG tablet Take 1 tablet (5 mg total) by mouth daily. 11/11/20   Caswell Ronnell BROCKS, MD  tizanidine  (ZANAFLEX ) 2 MG capsule Take 1 capsule (2 mg total) by mouth 3 (three) times daily as needed for muscle spasms. Do not take with alcohol or while driving or operating  heavy machinery 03/12/23   Chandra Harlene LABOR, NP      Allergies    Patient has no known allergies.    Review of Systems   Review of Systems  Musculoskeletal:  Positive for back pain.    Physical Exam Updated Vital Signs BP (!) 165/71   Pulse 60   Temp 97.9 F (36.6 C) (Oral)   Resp 17   Ht 6' 1 (1.854 m)   Wt 99.3 kg   SpO2 95%   BMI 28.89 kg/m  Physical Exam Vitals and nursing note reviewed.  Constitutional:      General: He is not in acute distress.    Appearance: He is well-developed.  HENT:     Head: Normocephalic and atraumatic.     Mouth/Throat:     Mouth: Mucous membranes are moist.  Eyes:     Conjunctiva/sclera: Conjunctivae normal.  Cardiovascular:     Rate and Rhythm: Normal rate and regular rhythm.     Heart sounds: No murmur heard. Pulmonary:     Effort: Pulmonary effort is normal. No respiratory distress.     Breath sounds: Normal breath sounds.  Abdominal:     Palpations: Abdomen is soft.     Tenderness: There is no abdominal tenderness. There is no right CVA tenderness or left CVA tenderness.  Musculoskeletal:  General: No swelling.     Cervical back: Neck supple.  Skin:    General: Skin is warm and dry.     Capillary Refill: Capillary refill takes less than 2 seconds.  Neurological:     General: No focal deficit present.     Mental Status: He is alert and oriented to person, place, and time.  Psychiatric:        Mood and Affect: Mood normal.     ED Results / Procedures / Treatments   Labs (all labs ordered are listed, but only abnormal results are displayed) Labs Reviewed  BASIC METABOLIC PANEL - Abnormal; Notable for the following components:      Result Value   Sodium 134 (*)    All other components within normal limits  URINALYSIS, ROUTINE W REFLEX MICROSCOPIC - Abnormal; Notable for the following components:   Color, Urine STRAW (*)    All other components within normal limits  CBC WITH DIFFERENTIAL/PLATELET     EKG None  Radiology CT ABDOMEN PELVIS WO CONTRAST Result Date: 05/02/2023 CLINICAL DATA:  Worsening left-sided abdominal and flank pain for several months. EXAM: CT ABDOMEN AND PELVIS WITHOUT CONTRAST TECHNIQUE: Multidetector CT imaging of the abdomen and pelvis was performed following the standard protocol without IV contrast. RADIATION DOSE REDUCTION: This exam was performed according to the departmental dose-optimization program which includes automated exposure control, adjustment of the mA and/or kV according to patient size and/or use of iterative reconstruction technique. COMPARISON:  None Available. FINDINGS: Lower chest: No acute findings. Hepatobiliary: No mass visualized on this unenhanced exam. Gallbladder is unremarkable. No evidence of biliary ductal dilatation. Pancreas: No mass or inflammatory process visualized on this unenhanced exam. Spleen:  Within normal limits in size. Adrenals/Urinary tract: 7 mm calculus seen in lower pole of right kidney. No evidence of ureteral calculi or hydronephrosis. Diffuse bladder wall thickening is seen, presumably due to chronic bladder outlet obstruction given enlarged prostate. Stomach/Bowel: A large hiatal hernia is seen which contains the entire stomach. Extensive colonic diverticulosis is seen, without signs of diverticulitis. No evidence of obstruction, inflammatory process, or abnormal fluid collections. Vascular/Lymphatic: No pathologically enlarged lymph nodes identified. No evidence of abdominal aortic aneurysm. Reproductive:  Moderately enlarged prostate. Other:  None. Musculoskeletal:  No suspicious bone lesions identified. IMPRESSION: Large hiatal hernia which contains the entire stomach. Extensive colonic diverticulosis, without radiographic evidence of diverticulitis. 7 mm right renal calculus. No evidence of ureteral calculi or hydronephrosis. Moderately enlarged prostate, with findings of chronic bladder outlet obstruction. Electronically  Signed   By: Norleen DELENA Kil M.D.   On: 05/02/2023 13:48   CT L-SPINE NO CHARGE Result Date: 05/02/2023 CLINICAL DATA:  Low back pain for 6 or more weeks which is persistent. EXAM: CT LUMBAR SPINE WITHOUT CONTRAST TECHNIQUE: Multidetector CT imaging of the lumbar spine was performed without intravenous contrast administration. Multiplanar CT image reconstructions were also generated. RADIATION DOSE REDUCTION: This exam was performed according to the departmental dose-optimization program which includes automated exposure control, adjustment of the mA and/or kV according to patient size and/or use of iterative reconstruction technique. COMPARISON:  Radiography 11/08/2021 FINDINGS: Segmentation: 5 lumbar type vertebral bodies. Alignment: Normal Vertebrae: No evidence of fracture or focal bone lesion. Paraspinal and other soft tissues: Apparent hiatal hernia. Aortic atherosclerosis. Disc levels: No significant disc level finding from T11-12 through L3-4. L4-5: Bulging of the disc. Mild facet and ligamentous hypertrophy. Narrowing of the lateral recesses that could possibly be symptomatic. L5-S1: Disc degeneration with vacuum phenomenon. Small  endplate osteophytes. No herniated disc material. Mild bilateral facet hypertrophy. No compressive canal stenosis. Mild bilateral foraminal narrowing but without visible compression of the exiting L5 nerves. IMPRESSION: 1. L4-5: Bulging of the disc. Mild facet and ligamentous hypertrophy. Narrowing of the lateral recesses that could possibly be symptomatic. 2. L5-S1: Disc degeneration with vacuum phenomenon. Small endplate osteophytes. Mild bilateral facet hypertrophy. Mild bilateral foraminal narrowing but without visible compression of the exiting L5 nerves. Aortic Atherosclerosis (ICD10-I70.0). Electronically Signed   By: Oneil Officer M.D.   On: 05/02/2023 12:48    Procedures Procedures    Medications Ordered in ED Medications  lidocaine  (LIDODERM ) 5 % 1 patch (1 patch  Transdermal Patch Applied 05/02/23 1201)  predniSONE  (DELTASONE ) tablet 40 mg (has no administration in time range)  fentaNYL  (SUBLIMAZE ) injection 25 mcg (25 mcg Intravenous Given 05/02/23 1201)    ED Course/ Medical Decision Making/ A&P                                 Medical Decision Making This patient presents to the ED for concern of left low back pain x 2 months, this involves an extensive number of treatment options, and is a complaint that carries with it a high risk of complications and morbidity.  The differential diagnosis includes muscle strain, HNP, UTI, ureterolithiasis, muscle spasm, fracture, malignancy, less likely paraspinous or epidural abscess or bleeding     Additional history obtained:  Additional history obtained from EMR External records from outside source obtained and reviewed including notes   Lab Tests:  I Ordered, and personally interpreted labs.  The pertinent results include: CBC, BMP and urinalysis are all reassuring   Imaging Studies ordered:  I ordered imaging studies including abdomen pelvis; CT L-spine I independently visualized and interpreted imaging which showed CT on pelvis shows a chronic bladder outlet obstruction from enlarged prostate, right renal calculus but no ureteral lithiasis or hydronephrosis; CT L-spine shows L4-L5 bulging disc with mild facet and ligamentous hypertrophy narrowing of the lateral recesses and disc generation at L5-S1 with mild bilateral facet hypertrophy I agree with the radiologist interpretation     Problem List / ED Course / Critical interventions / Medication management  Left low back pain x 2 months, not improving with conservative care at home.  He has no red flag symptoms for back pain other than his age has no numbness tingling or weakness in his extremities.  No trauma noted.  Given that it is now somewhat radiating to his abdomen CT ordered which shows normal except for incidental findings as above, no  ureterolithiasis, diverticulitis.  Labs are normal, no UTI.  Patient had good relief of his pain with 25 mcg of fentanyl  and a lidocaine  patch but states the fentanyl  knocked him out.  Will avoid any further narcotics but discussed that short course of prednisone  may be helpful in addition to follow-up with spine specialist.  Given follow-up and strict return precautions, continue OTC Tylenol as needed.  Given strict return precautions. I have reviewed the patients home medicines and have made adjustments as needed      Amount and/or Complexity of Data Reviewed Labs: ordered. Radiology: ordered.  Risk Prescription drug management.           Final Clinical Impression(s) / ED Diagnoses Final diagnoses:  None    Rx / DC Orders ED Discharge Orders     None  Suellen Sherran DELENA DEVONNA 05/02/23 1400    Albertina Dixon, MD 05/02/23 [phone]

## 2023-05-02 NOTE — Discharge Instructions (Addendum)
 It was a pleasure taking care of you today.  You are seen in the emergency department for left-sided low back pain for the past 2 months.  You do have a bulging disc at the level L4-L5 and need to follow-up with the spine specialist.  I have given you prescription for steroids which may decrease inflammation and help your symptoms.  You should continue taking over-the-counter Tylenol as directed on the packaging, and use the topical lidocaine  patches.  Come back to the ER if you have severe pain, weakness in your legs, other new or worsening symptoms.

## 2023-05-11 ENCOUNTER — Telehealth: Payer: Self-pay

## 2023-05-11 NOTE — Progress Notes (Signed)
Transition Care Management Unsuccessful Follow-up Telephone Call  Date of discharge and from where:  [facility]   Attempts:  1st Attempt  Reason for unsuccessful TCM follow-up call:  No answer/busy   Lenard Forth [facility] Guide, Phone: [phone] Fax: [phone] Website: [email or web address]

## 2023-05-14 ENCOUNTER — Telehealth: Payer: Self-pay

## 2023-05-14 NOTE — Progress Notes (Signed)
Transition Care Management Unsuccessful Follow-up Telephone Call  Date of discharge and from where:  Joel Guerra 1/8  Attempts:  2nd Attempt  Reason for unsuccessful TCM follow-up call:  No answer/busy   Lenard Forth [facility] Guide, Phone: [phone] Fax: [phone] Website: [email or web address]

## 2023-05-17 ENCOUNTER — Encounter: Payer: Self-pay | Admitting: Emergency Medicine

## 2023-05-17 ENCOUNTER — Other Ambulatory Visit: Payer: Self-pay

## 2023-05-17 ENCOUNTER — Ambulatory Visit
Admission: EM | Admit: 2023-05-17 | Discharge: 2023-05-17 | Disposition: A | Discharge status: Home / Self Care | Payer: Medicare Other | Attending: Nurse Practitioner | Admitting: Nurse Practitioner

## 2023-05-17 DIAGNOSIS — R062 Wheezing: Secondary | ICD-10-CM | POA: Diagnosis not present

## 2023-05-17 DIAGNOSIS — J22 Unspecified acute lower respiratory infection: Secondary | ICD-10-CM

## 2023-05-17 MED ORDER — AZITHROMYCIN 250 MG PO TABS: ORAL_TABLET | ORAL | 0 refills | Status: AC

## 2023-05-17 MED ORDER — BENZONATATE 100 MG PO CAPS: 100.0000 mg | ORAL_CAPSULE | Freq: Three times a day (TID) | ORAL | 0 refills | Status: AC | PRN

## 2023-05-17 MED ORDER — ALBUTEROL SULFATE HFA 108 (90 BASE) MCG/ACT IN AERS: 1.0000 | INHALATION_SPRAY | Freq: Four times a day (QID) | RESPIRATORY_TRACT | 0 refills | Status: AC | PRN

## 2023-05-17 MED ORDER — PREDNISONE 20 MG PO TABS: 40.0000 mg | ORAL_TABLET | Freq: Every day | ORAL | 0 refills | Status: AC

## 2023-05-17 MED ORDER — IPRATROPIUM-ALBUTEROL 0.5-2.5 (3) MG/3ML IN SOLN
3.0000 mL | Freq: Once | RESPIRATORY_TRACT | Status: AC
  Administered 2023-05-17: 3 mL via RESPIRATORY_TRACT

## 2023-05-17 NOTE — ED Triage Notes (Addendum)
Pt reports runny nose, head congestion, fatigue,headache, intermittent wheezing, cough x2 weeks. Has been taking otc cough medication with no change in symptoms.home covid negative yesterday.

## 2023-05-17 NOTE — ED Provider Notes (Signed)
[facility]    CSN: [id number] Arrival date & time: 05/17/23  1559      History   Chief Complaint No chief complaint on file.   HPI Joel Guerra is a 83 y.o. male.   Patient presents today with 2-week history of dry cough, shortness of breath, wheezing, runny and stuffy nose, headache, and fatigue.  Reports bleeding is worse at nighttime.  Denies fever, bodyaches or chills, congested cough, chest pain, sore throat, abdominal pain, nausea/vomiting, diarrhea, and change in appetite.  Has been taking over-the-counter Alka-Seltzer plus extra strength and Flonase for symptoms with minimal improvement.  Patient has history of chronic lung disease.  Denies ever needing inhalers in the past.  Denies smoking or history of smoking.    Past Medical History:  Diagnosis Date   HLD (hyperlipidemia) 01/20/2019   Hypertension    Murmur    Prediabetes 01/20/2019   Retinal detachment    Right   Vitamin D deficiency disease 01/20/2019    Patient Active Problem List   Diagnosis Date Noted   Encounter for general adult medical examination with abnormal findings 05/01/2019   Blurred vision, right eye 05/01/2019   Testicular discomfort 05/01/2019   Encounter to discuss test results 05/01/2019   Prediabetes 01/20/2019   HLD (hyperlipidemia) 01/20/2019   Vitamin D deficiency disease 01/20/2019    Past Surgical History:  Procedure Laterality Date   ANKLE ARTHROPLASTY  1970   cataract Bilateral    FOOT SURGERY     LACRIMAL GLAND BIOPSY     other  2008   Parotid gland stone removal    RETINAL DETACHMENT SURGERY Right        Home Medications    Prior to Admission medications   Medication Sig Start Date End Date Taking? Authorizing Provider  albuterol (VENTOLIN HFA) 108 (90 Base) MCG/ACT inhaler Inhale 1-2 puffs into the lungs every 6 (six) hours as needed for wheezing or shortness of breath. 05/17/23  Yes Valentino Nose, NP  azithromycin (ZITHROMAX) 250 MG tablet  Take (2) tablets by mouth on day 1, then take (1) tablet by mouth on days 2-5. 05/17/23  Yes Valentino Nose, NP  benzonatate (TESSALON) 100 MG capsule Take 1 capsule (100 mg total) by mouth 3 (three) times daily as needed for cough. Do not take with alcohol or while operating or driving heavy machinery 7/82/95  Yes Cathlean Marseilles A, NP  predniSONE (DELTASONE) 20 MG tablet Take 2 tablets (40 mg total) by mouth daily with breakfast for 5 days. 05/17/23 05/22/23 Yes Cathlean Marseilles A, NP  amLODipine (NORVASC) 2.5 MG tablet TAKE 1 TABLET(2.5 MG) BY MOUTH DAILY Patient not taking: Reported on 05/30/2022 09/24/20   Elenore Paddy, NP  amLODipine (NORVASC) 5 MG tablet Take 5 mg by mouth daily.    [provider]  Ascorbic Acid (VITAMIN C) 500 MG CAPS Take 1 capsule by mouth daily as needed.    [provider]  aspirin 81 MG chewable tablet Chew 81 mg by mouth daily.    [provider]  Cholecalciferol (VITAMIN D) 125 MCG (5000 UT) CAPS Take 5,000 Units by mouth daily.     [provider]  lidocaine (LIDODERM) 5 % Place 1 patch onto the skin daily. Remove & Discard patch within 12 hours or as directed by MD 05/02/23   Carmel Sacramento A, PA-C  Polyethyl Glycol-Propyl Glycol (SYSTANE) 0.4-0.3 % SOLN Apply 1 drop to eye daily.    [provider]  RESTASIS  0.05 % ophthalmic emulsion as needed. 01/30/20   [provider]  tadalafil (CIALIS) 5 MG tablet Take 1 tablet (5 mg total) by mouth daily. 11/11/20   Wilson Singer, MD  tizanidine (ZANAFLEX) 2 MG capsule Take 1 capsule (2 mg total) by mouth 3 (three) times daily as needed for muscle spasms. Do not take with alcohol or while driving or operating heavy machinery 03/12/23   Valentino Nose, NP    Family History Family History  Problem Relation Age of Onset   Cancer Father        laryngal cancer   Diabetes Sister    Osteoporosis Brother    Heart disease Other     Social History Social History    Tobacco Use   Smoking status: Never   Smokeless tobacco: Never  Vaping Use   Vaping status: Never Used  Substance Use Topics   Alcohol use: Yes    Alcohol/week: 2.0 standard drinks of alcohol    Types: 2 Cans of beer per week    Comment: Every day   Drug use: Never     Allergies   Fentanyl   Review of Systems Review of Systems Per HPI  Physical Exam Triage Vital Signs ED Triage Vitals  Encounter Vitals Group     BP 05/17/23 1615 (!) 141/82     Systolic BP Percentile --      Diastolic BP Percentile --      Pulse Rate 05/17/23 1615 76     Resp 05/17/23 1615 18     Temp 05/17/23 1615 97.8 F (36.6 C)     Temp Source 05/17/23 1615 Oral     SpO2 05/17/23 1615 95 %     Weight --      Height --      Head Circumference --      Peak Flow --      Pain Score 05/17/23 1612 2     Pain Loc --      Pain Education --      Exclude from Growth Chart --    No data found.  Updated Vital Signs BP (!) 141/82 (BP Location: Right Arm)   Pulse 76   Temp 97.8 F (36.6 C) (Oral)   Resp 18   SpO2 (S) 98% Comment: after duoneb  Visual Acuity Right Eye Distance:   Left Eye Distance:   Bilateral Distance:    Right Eye Near:   Left Eye Near:    Bilateral Near:     Physical Exam Vitals and nursing note reviewed.  Constitutional:      General: He is not in acute distress.    Appearance: Normal appearance. He is not ill-appearing or toxic-appearing.  HENT:     Head: Normocephalic and atraumatic.     Right Ear: Tympanic membrane, ear canal and external ear normal.     Left Ear: Tympanic membrane, ear canal and external ear normal.     Nose: No congestion or rhinorrhea.     Mouth/Throat:     Mouth: Mucous membranes are moist.     Pharynx: Oropharynx is clear. No oropharyngeal exudate or posterior oropharyngeal erythema.  Eyes:     General: No scleral icterus.    Extraocular Movements: Extraocular movements intact.  Cardiovascular:     Rate and Rhythm: Normal rate and  regular rhythm.  Pulmonary:     Effort: Pulmonary effort is normal. No respiratory distress.     Breath sounds: Wheezing present. No rhonchi or rales.  Musculoskeletal:     Cervical back: Normal range of motion and neck supple.  Lymphadenopathy:     Cervical: No cervical adenopathy.  Skin:    General: Skin is warm and dry.     Coloration: Skin is not jaundiced or pale.     Findings: No erythema or rash.  Neurological:     Mental Status: He is alert and oriented to person, place, and time.  Psychiatric:        Behavior: Behavior is cooperative.      UC Treatments / Results  Labs (all labs ordered are listed, but only abnormal results are displayed) Labs Reviewed - No data to display  EKG   Radiology No results found.  Procedures Procedures (including critical care time)  Medications Ordered in UC Medications  ipratropium-albuterol (DUONEB) 0.5-2.5 (3) MG/3ML nebulizer solution 3 mL (3 mLs Nebulization Given 05/17/23 1705)    Initial Impression / Assessment and Plan / UC Course  I have reviewed the triage vital signs and the nursing notes.  Pertinent labs & imaging results that were available during my care of the patient were reviewed by me and considered in my medical decision making (see chart for details).   In triage, patient is mildly hypertensive, otherwise vital signs are stable.  After DuoNeb, SpO2 increased from 93% room air to 98% on room air.  1. Acute lower respiratory infection 2. Wheezing Vitals and exam are reassuring today Symptoms proved after breathing treatment Start albuterol inhaler at home scheduled every 4-6 hours for 48 hours, then use as needed Start cough suppressant medication, continue Mucinex, Flonase, start oral prednisone Will cover for atypical lung infection with oral azithromycin Return and ER precautions discussed with patient and spouse  The patient was given the opportunity to ask questions.  All questions answered to their  satisfaction.  The patient is in agreement to this plan.    Final Clinical Impressions(s) / UC Diagnoses   Final diagnoses:  Acute lower respiratory infection  Wheezing     Discharge Instructions      We gave you a breathing treatment today to help with lung inflammation.  Start using the albuterol inhaler every 4-6 hours scheduled for the next couple days to help keep your airway open.  Thereafter, use the inhaler as needed.  In addition, continue Flonase and plain Mucinex.  Start [employer] as needed to help suppress cough, azithromycin, and oral prednisone to help with lung inflammation.  Seek care if symptoms do not improve with treatment.     ED Prescriptions     Medication Sig Dispense Auth. Provider   albuterol (VENTOLIN HFA) 108 (90 Base) MCG/ACT inhaler Inhale 1-2 puffs into the lungs every 6 (six) hours as needed for wheezing or shortness of breath. 6.7 g Cathlean Marseilles A, NP   benzonatate (TESSALON) 100 MG capsule Take 1 capsule (100 mg total) by mouth 3 (three) times daily as needed for cough. Do not take with alcohol or while operating or driving heavy machinery 21 capsule Cathlean Marseilles A, NP   predniSONE (DELTASONE) 20 MG tablet Take 2 tablets (40 mg total) by mouth daily with breakfast for 5 days. 10 tablet Cathlean Marseilles A, NP   azithromycin (ZITHROMAX) 250 MG tablet Take (2) tablets by mouth on day 1, then take (1) tablet by mouth on days 2-5. 6 tablet Valentino Nose, NP      PDMP not reviewed this encounter.   Valentino Nose, NP 05/17/23 [phone]

## 2023-05-17 NOTE — Discharge Instructions (Signed)
We gave you a breathing treatment today to help with lung inflammation.  Start using the albuterol inhaler every 4-6 hours scheduled for the next couple days to help keep your airway open.  Thereafter, use the inhaler as needed.  In addition, continue Flonase and plain Mucinex.  Start [employer] as needed to help suppress cough, azithromycin, and oral prednisone to help with lung inflammation.  Seek care if symptoms do not improve with treatment.

## 2023-05-29 ENCOUNTER — Ambulatory Visit: Payer: Medicare Other | Admitting: Urology
